# Patient Record
Sex: Male | Born: 1955 | Race: Black or African American | Hispanic: No | Marital: Married | Smoking: Current every day smoker
Health system: Southern US, Community
[De-identification: ages and names within clinical notes are randomized; demographics above are authoritative.]

## PROBLEM LIST (undated history)

## (undated) DIAGNOSIS — E119 Type 2 diabetes mellitus without complications: Secondary | ICD-10-CM

## (undated) DIAGNOSIS — F209 Schizophrenia, unspecified: Secondary | ICD-10-CM

---

## 2003-12-29 ENCOUNTER — Emergency Department (HOSPITAL_COMMUNITY): Admission: EM | Admit: 2003-12-29 | Discharge: 2003-12-29 | Payer: Self-pay | Admitting: Emergency Medicine

## 2007-04-10 ENCOUNTER — Emergency Department (HOSPITAL_COMMUNITY): Admission: EM | Admit: 2007-04-10 | Discharge: 2007-04-10 | Payer: Self-pay | Admitting: Emergency Medicine

## 2007-04-26 ENCOUNTER — Emergency Department (HOSPITAL_COMMUNITY): Admission: EM | Admit: 2007-04-26 | Discharge: 2007-04-26 | Payer: Self-pay | Admitting: Emergency Medicine

## 2009-03-17 ENCOUNTER — Inpatient Hospital Stay: Payer: Self-pay | Admitting: Psychiatry

## 2010-04-27 ENCOUNTER — Emergency Department: Payer: Self-pay | Admitting: Emergency Medicine

## 2010-09-13 ENCOUNTER — Emergency Department (HOSPITAL_COMMUNITY)
Admission: EM | Admit: 2010-09-13 | Discharge: 2010-09-14 | Discharge status: Against Medical Advice | Payer: Medicare Other | Source: Home / Self Care | Attending: Emergency Medicine | Admitting: Emergency Medicine

## 2010-09-13 DIAGNOSIS — F29 Unspecified psychosis not due to a substance or known physiological condition: Secondary | ICD-10-CM | POA: Insufficient documentation

## 2010-09-13 LAB — CBC
HCT: 37 % — ABNORMAL LOW (ref 39.0–52.0)
MCHC: 34.6 g/dL (ref 30.0–36.0)
RDW: 12.4 % (ref 11.5–15.5)

## 2010-09-13 LAB — ETHANOL: Alcohol, Ethyl (B): <5 mg/dL (ref 0–10)

## 2010-09-13 LAB — COMPREHENSIVE METABOLIC PANEL
ALT: 16 U/L (ref 0–53)
Calcium: 10.1 mg/dL (ref 8.4–10.5)
Glucose, Bld: 214 mg/dL — ABNORMAL HIGH (ref 70–99)
Sodium: 136 mEq/L (ref 135–145)
Total Protein: 7.6 g/dL (ref 6.0–8.3)

## 2010-09-13 LAB — DIFFERENTIAL
Basophils Absolute: 0 10*3/uL (ref 0.0–0.1)
Basophils Relative: 0 % (ref 0–1)
Eosinophils Relative: 0 % (ref 0–5)
Lymphocytes Relative: 15 % (ref 12–46)
Monocytes Absolute: 0.8 10*3/uL (ref 0.1–1.0)

## 2010-09-13 LAB — RAPID URINE DRUG SCREEN, HOSP PERFORMED
Amphetamines: NOT DETECTED
Barbiturates: NOT DETECTED
Benzodiazepines: NOT DETECTED
Cocaine: NOT DETECTED
Opiates: NOT DETECTED

## 2010-09-13 LAB — ACETAMINOPHEN LEVEL: Acetaminophen (Tylenol), Serum: <10 ug/mL — ABNORMAL LOW (ref 10–30)

## 2010-09-13 LAB — SALICYLATE LEVEL: Salicylate Lvl: <4 mg/dL (ref 2.8–20.0)

## 2010-09-14 ENCOUNTER — Inpatient Hospital Stay (HOSPITAL_COMMUNITY): Admit: 2010-09-14 | Payer: Medicaid Other | Admitting: Psychiatry

## 2010-09-14 ENCOUNTER — Inpatient Hospital Stay (HOSPITAL_COMMUNITY)
Admission: RE | Admit: 2010-09-14 | Discharge: 2010-09-20 | DRG: 885 | Disposition: A | Discharge status: Home / Self Care | Payer: Medicare Other | Source: Ambulatory Visit | Attending: Psychiatry | Admitting: Psychiatry

## 2010-09-14 DIAGNOSIS — E785 Hyperlipidemia, unspecified: Secondary | ICD-10-CM

## 2010-09-14 DIAGNOSIS — Z7982 Long term (current) use of aspirin: Secondary | ICD-10-CM

## 2010-09-14 DIAGNOSIS — I10 Essential (primary) hypertension: Secondary | ICD-10-CM

## 2010-09-14 DIAGNOSIS — F17200 Nicotine dependence, unspecified, uncomplicated: Secondary | ICD-10-CM

## 2010-09-14 DIAGNOSIS — F201 Disorganized schizophrenia: Principal | ICD-10-CM

## 2010-09-14 DIAGNOSIS — E119 Type 2 diabetes mellitus without complications: Secondary | ICD-10-CM

## 2010-09-14 LAB — GLUCOSE, CAPILLARY: Glucose-Capillary: 203 mg/dL — ABNORMAL HIGH (ref 70–99)

## 2010-09-15 DIAGNOSIS — F259 Schizoaffective disorder, unspecified: Secondary | ICD-10-CM

## 2010-09-15 LAB — GLUCOSE, CAPILLARY: Glucose-Capillary: 124 mg/dL — ABNORMAL HIGH (ref 70–99)

## 2010-09-16 NOTE — H&P (Addendum)
NAME:  Julian [name], BISSONNETTE NO.:  [id number]  MEDICAL RECORD NO.:  [id number]           PATIENT TYPE:  E  LOCATION:  BH                        FACILITY:  BH  PHYSICIAN:  Eulogio Ditch, MD DATE OF BIRTH:  12-22-1955  DATE OF ADMISSION:  03/14/201                      PSYCHIATRIC ADMISSION ASSESSMENT   HISTORY OF PRESENT ILLNESS:  A 55 year old male living with the mother with a history of schizophrenia, disorganized type followed in the outpatient setting by the ACT team.  The patient is admitted because of his disorganized behavior.  The patient is grandiose thinking that he has a job in different places.  He is in this country and has a job outside the country like in [place].  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  The patient is hyperreligious. Denies hearing any voices.  Denies any suicidal or homicidal ideations. The patient is followed by Dr. Shirline Frees latiff in the outpatient settings.  PSYCH. MEDICATIONS: 1. Invega Sustenna 234 mg IM every 4 weeks. 2. Zyprexa 15 mg twice daily.  As per the note on August 09, 2010,     the patient has no side effects from these medications.  The     patient has a history of noncompliant with his medications.  The     patient lives at a group home.  SUBSTANCE ABUSE:  The patient denies any substance abuse history.  MEDICAL HISTORY:  History of diabetes, hyperlipidemia, hypertension.  ALLERGIES:  Depakote.  PHYSICAL EXAMINATION:  Done at [facility] within normal limits.  LABORATORY DATA:  Done at [facility] within normal limits.  UDS negative.  His glucose was 203.  MENTAL STATUS EXAM:  The patient is calm, cooperative during the interview.Elated mood.  Hygiene and grooming fair.  No abnormal movements noticed.  No psychomotor agitation or retardation noted. Speech increase in rate, rhythm, but has  no pressured speech.  Thought process:  Patient is redirectable.  He is grandiose.  Sometimes seems to be internally preoccupied but denies hearing any voices.  Cognition: Alert, awake, oriented x3.  Memory immediate,  Recent, remote- fair to poor.  Attention and concentration poor.  Abstraction ability poor. Fund of knowledge poor.  Insight and judgment poor.  DIAGNOSIS:  Axis I:  As per history, schizophrenia, disorganized type, rule out schizoaffective disorder. Axis II:  Deferred. Axis III:  Diabetes mellitus, hypertension, hyperlipidemia. Axis IV:  Chronic mental issues. Axis V:  30 to 40.  TREATMENT PLAN:  The patient will be started on his regular combination of medications: 1. Zyprexa 15 mg twice daily. 2. I added the Depakote ER 500 mg three times a day.  We will get     Depakote level after 5 days. 3. The patient advised to go to all the groups while staying in the     hospital. 4. Side effects, risks and benefits of the medication discussed with     the patient. 5. We will get more collateral information on this patient. 6. Estimated length of stay  in the hospital will be 5-7 days.     Eulogio Ditch, MD     SA/MEDQ  D:  09/15/2010  T:  09/15/2010  Job:  [id number]  Electronically Signed by Eulogio Ditch  on 09/16/2010 04:02:01 AM

## 2010-09-17 LAB — GLUCOSE, CAPILLARY

## 2010-09-18 LAB — GLUCOSE, CAPILLARY
Glucose-Capillary: 169 mg/dL — ABNORMAL HIGH (ref 70–99)
Glucose-Capillary: 183 mg/dL — ABNORMAL HIGH (ref 70–99)

## 2010-09-24 NOTE — Discharge Summary (Signed)
NAME:  Julian [name], Julian [name]                ACCOUNT NO.:  [id number]  MEDICAL RECORD NO.:  [id number]           PATIENT TYPE:  I  LOCATION:  0403                          FACILITY:  BH  PHYSICIAN:  Eulogio Ditch, MD DATE OF BIRTH:  1956-02-24  DATE OF ADMISSION:  09/14/2010 DATE OF DISCHARGE:  09/20/2010                              DISCHARGE SUMMARY   IDENTIFYING INFORMATION:  A 55 year old male.  This is a voluntary admission.  HISTORY OF PRESENT ILLNESS:  First [facility] admission for [place], who has a history of schizophrenia, disorganized type.  He has been living in a group home in [place] and got upset with the group home director because he is a chain smoker and they are refused to give him and his cigarettes apparently when he was demanding them at odd hours, such as wanting to smoke through the night.  He apparently called the police on the group home manager to charge her with theft to personal property.  His ACT team member was called to help negotiate the situation and admission was recommended.  He initially presented calm and cooperative.  Although he is rather resistant to rules, he responded well with explanations of the group home structure.  He is well-educated and has a Scientist, water quality in Environmental manager and is generally compliant with medications.  He has a history of multiple hospitalizations.  MEDICAL EVALUATION:  He was medically evaluated in the emergency room, where a full physical exam was done.  Normally-developed African American male who presents well-groomed and appropriately dressed. Followed by Dr. Felecia Shelling, his primary care physician.  Chronic medical problems include hypertension, diabetes, dyslipidemia and schizophrenia. He also does smoke significant amounts of cigarettes.  ADMITTING DIAGNOSTIC STUDIES:  Revealed a random glucose of 203. Salicylate and acetaminophen levels were negative.  Alcohol level negative.  Chemistry normal.  Random glucose 214, BUN 8,  creatinine 0.83.  Liver enzymes normal.  CBC:  WBC 9.1, hemoglobin 12.8, hematocrit 37, platelets 383,000.  Urine drug screen negative for all substances.  COURSE OF HOSPITALIZATION:  He was at admitted on an involuntary basis to our acute stabilization and intensive care unit and gradually assimilated into the milieu.  He was appropriate with staff and peers throughout his stay and smoking is not permitted on our unit and he complied with this without difficulty.  We resumed his routine medications, which included the Invega Sustenna 234 mg IM q. 30 days, which was last given on February 27.  Magnesium oxide 400 mg b.i.d., lisinopril 20 mg daily, metformin 500 mg b.i.d. and Zyprexa 15 mg p.o. b.i.d. and Claritin 10 mg at bedtime and HCTZ 12.5 mg daily.  Our case manager made contact with psychotherapeutic services who are following him to coordinate care.  He did not require further adjustments in medications, was calm and cooperative on the unit.  He endorsed that he was anxious to move to a group home in [place] so that he could be closer to family.  Our case manager contacted his legal guardian, who confirmed the patient's plans to be interviewed at a new group home in [place] on March 20 at 1:00  p.m.  The plan was that he would stay then with his mother for two nights in [place] and return to the group home. On the 20th, he was fully alert, in full contact with reality.  No delusional statements.  Had been taking his medications regularly.  Was in good humor, showing considerate behavior to his peers and good manners. Participation in group therapy was excellent.  DISCHARGE DIAGNOSES:  AXIS I:  Schizophrenia, disorganized type. AXIS II:  No diagnosis. AXIS III:  Diabetes, dyslipidemia. AXIS IV:  Significant issues with group home living. AXIS V: Current 57, past year 58-60.  DISCHARGE CONDITION:  Stable.  PLAN:  The plan is to follow up with the [facility] ACT team on Monday,  March 26.  DISCHARGE MEDICATIONS: 1. Invega 234 mg IM q. month, next due September 27, 2010. 2. Zyprexa 15 mg 1 tablet a.m. and at bedtime. 3. Metformin 500 mg 1 tablet b.i.d. 4. Aspirin 81 mg daily. 5. HCTZ 12.5 mg daily. 6. Lisinopril 20 mg daily. 7. Claritin 10 mg daily at bedtime. 8. Mag-Ox 400 mg twice daily. 9. Simvastatin 40 mg daily at bedtime. 10.Benadryl 50 mg daily as needed for allergy.     Margaret A. Lorin Picket, N.P.   ______________________________ Eulogio Ditch, MD    MAS/MEDQ  D:  09/21/2010  T:  09/21/2010  Job:  [id number]  Electronically Signed by Kari Baars N.P. on 09/23/2010 03:42:35 PM Electronically Signed by Eulogio Ditch  on 09/24/2010 07:17:35 AM

## 2011-04-12 LAB — DIFFERENTIAL
Eosinophils Absolute: 0.2
Eosinophils Relative: 2
Lymphocytes Relative: 20
Lymphs Abs: 2.1
Monocytes Relative: 7
Neutrophils Relative %: 72

## 2011-04-12 LAB — RAPID URINE DRUG SCREEN, HOSP PERFORMED
Amphetamines: NOT DETECTED
Benzodiazepines: NOT DETECTED
Opiates: NOT DETECTED
Tetrahydrocannabinol: NOT DETECTED

## 2011-04-12 LAB — CBC
HCT: 38.7 — ABNORMAL LOW
MCV: 90.4
RBC: 4.28
WBC: 10.6 — ABNORMAL HIGH

## 2011-04-12 LAB — BASIC METABOLIC PANEL
Chloride: 108
GFR calc Af Amer: >60
GFR calc non Af Amer: >60
Potassium: 3.8
Sodium: 144

## 2011-04-12 LAB — ETHANOL: Alcohol, Ethyl (B): <5

## 2011-04-13 LAB — WOUND CULTURE

## 2014-02-25 ENCOUNTER — Encounter (HOSPITAL_COMMUNITY): Payer: Self-pay | Admitting: Emergency Medicine

## 2014-02-25 ENCOUNTER — Emergency Department (HOSPITAL_COMMUNITY)
Admission: EM | Admit: 2014-02-25 | Discharge: 2014-02-27 | Disposition: A | Discharge status: Home / Self Care | Payer: Medicaid Other | Attending: Emergency Medicine | Admitting: Emergency Medicine

## 2014-02-25 DIAGNOSIS — F17200 Nicotine dependence, unspecified, uncomplicated: Secondary | ICD-10-CM | POA: Insufficient documentation

## 2014-02-25 DIAGNOSIS — Z79899 Other long term (current) drug therapy: Secondary | ICD-10-CM | POA: Diagnosis not present

## 2014-02-25 DIAGNOSIS — Z046 Encounter for general psychiatric examination, requested by authority: Secondary | ICD-10-CM | POA: Insufficient documentation

## 2014-02-25 DIAGNOSIS — F22 Delusional disorders: Secondary | ICD-10-CM | POA: Insufficient documentation

## 2014-02-25 DIAGNOSIS — R413 Other amnesia: Secondary | ICD-10-CM | POA: Insufficient documentation

## 2014-02-25 DIAGNOSIS — F29 Unspecified psychosis not due to a substance or known physiological condition: Secondary | ICD-10-CM | POA: Diagnosis present

## 2014-02-25 LAB — COMPREHENSIVE METABOLIC PANEL
ALT: 24 U/L (ref 0–53)
ANION GAP: 16 — AB (ref 5–15)
AST: 25 U/L (ref 0–37)
Albumin: 4.1 g/dL (ref 3.5–5.2)
Alkaline Phosphatase: 88 U/L (ref 39–117)
BILIRUBIN TOTAL: 0.3 mg/dL (ref 0.3–1.2)
BUN: 6 mg/dL (ref 6–23)
CALCIUM: 9.8 mg/dL (ref 8.4–10.5)
CO2: 24 mEq/L (ref 19–32)
Chloride: 97 mEq/L (ref 96–112)
Creatinine, Ser: 0.74 mg/dL (ref 0.50–1.35)
GFR calc Af Amer: >90 mL/min (ref >90)
GFR calc non Af Amer: >90 mL/min (ref >90)
Glucose, Bld: 206 mg/dL — ABNORMAL HIGH (ref 70–99)
Potassium: 3.4 mEq/L — ABNORMAL LOW (ref 3.7–5.3)
Sodium: 137 mEq/L (ref 137–147)
TOTAL PROTEIN: 7.7 g/dL (ref 6.0–8.3)

## 2014-02-25 LAB — RAPID URINE DRUG SCREEN, HOSP PERFORMED
Amphetamines: NOT DETECTED
BARBITURATES: NOT DETECTED
BENZODIAZEPINES: NOT DETECTED
COCAINE: NOT DETECTED
OPIATES: NOT DETECTED
Tetrahydrocannabinol: NOT DETECTED

## 2014-02-25 LAB — CBC
HEMATOCRIT: 36.3 % — AB (ref 39.0–52.0)
Hemoglobin: 12.4 g/dL — ABNORMAL LOW (ref 13.0–17.0)
MCH: 30.5 pg (ref 26.0–34.0)
MCHC: 34.2 g/dL (ref 30.0–36.0)
MCV: 89.2 fL (ref 78.0–100.0)
Platelets: 309 10*3/uL (ref 150–400)
RBC: 4.07 MIL/uL — ABNORMAL LOW (ref 4.22–5.81)
RDW: 13 % (ref 11.5–15.5)
WBC: 7.8 10*3/uL (ref 4.0–10.5)

## 2014-02-25 LAB — SALICYLATE LEVEL

## 2014-02-25 LAB — ETHANOL

## 2014-02-25 LAB — ACETAMINOPHEN LEVEL: Acetaminophen (Tylenol), Serum: <15 ug/mL (ref 10–30)

## 2014-02-25 MED ORDER — ONDANSETRON HCL 4 MG PO TABS: 4.0000 mg | ORAL_TABLET | Freq: Three times a day (TID) | ORAL | Status: DC | PRN

## 2014-02-25 MED ORDER — NICOTINE 21 MG/24HR TD PT24
21.0000 mg | MEDICATED_PATCH | Freq: Every day | TRANSDERMAL | Status: DC
  Filled 2014-02-25: qty 1

## 2014-02-25 MED ORDER — LORAZEPAM 1 MG PO TABS: 1.0000 mg | ORAL_TABLET | Freq: Three times a day (TID) | ORAL | Status: DC | PRN

## 2014-02-25 MED ORDER — ACETAMINOPHEN 325 MG PO TABS: 650.0000 mg | ORAL_TABLET | ORAL | Status: DC | PRN

## 2014-02-25 MED ORDER — IBUPROFEN 200 MG PO TABS: 600.0000 mg | ORAL_TABLET | Freq: Three times a day (TID) | ORAL | Status: DC | PRN

## 2014-02-25 NOTE — ED Notes (Signed)
Spoke with Jeni Salles of [facility] [phone]) he states pt was to be evaluated by [facility] this morning but pt was uncooperative and walk away from car he was traveling in, staff at group home [phone]) state pt did have morning medication but was denies home visit to [facility] to see mother by his Guardian DSS [facility]. Mr. Dan Humphreys states they are not a facility that can restrain so they are unable to physically restrain patient. Pt has only been at this facility less than 30 days. Pt used his own funds to come to [employer] [facility] in [employer], then to [employer] by [employer].

## 2014-02-25 NOTE — ED Notes (Signed)
Pt BIB [employer].  Pt's family owns a funeral home.  Called [employer] because pt showed up uninvited to the funeral home. They were about to do a service when they noticed that all the medals of the deceased individuals were missing.  They believe he has taken them.  Pt states that he wants to see a psychiatrist because his group home owner is trying to kill him.  Pt is paranoid when trying to get his vital signs.  Family states that he is off his meds.  "He sells cocaine and counterfeit DVDs but he got dis group home thing too!".  Denies SI/HI.

## 2014-02-25 NOTE — ED Provider Notes (Signed)
TIME SEEN: 8:50 PM  CHIEF COMPLAINT: Paranoia  HPI: Patient is a 58 year old male with a history of schizophrenia, insulin-dependent diabetes who presents to the emergency department with paranoia. Patient was brought in by [facility] police department. Patient reports that he lives at a group home and he thinks his group home staff is trying to kill him. He states "they are trying to euthanize me". He states that the group home staff are drug dealers and may have threatened him. He states they are selling cocaine and illegal DVDs.  Denies SI or HI. Denies hallucinations. States he has been taking his medication.  Please also reports that he took a bus from his group home in [place] to his brother's funeral home in [place]. At the funeral home he was acting abnormally and was agitated. Police were contacted.  Patient denies any current medical complaints. Denies any illicit drug or alcohol use.   ROS: See HPI Constitutional: no fever  Eyes: no drainage  ENT: no runny nose   Cardiovascular:  no chest pain  Resp: no SOB  GI: no vomiting GU: no dysuria Integumentary: no rash  Allergy: no hives  Musculoskeletal: no leg swelling  Neurological: no slurred speech ROS otherwise negative  PAST MEDICAL HISTORY/PAST SURGICAL HISTORY:  History reviewed. No pertinent past medical history.  MEDICATIONS:  Prior to Admission medications   Medication Sig Start Date End Date Taking? Authorizing Provider  Paliperidone Palmitate (INVEGA SUSTENNA) 234 MG/1.5ML SUSP Inject 234 mg into the muscle every 30 (thirty) days.    Yes Historical Provider, MD    ALLERGIES:  Allergies  Allergen Reactions  . Haldol [Haloperidol Lactate]     seizures  . Prolixin [Fluphenazine]     seizures    SOCIAL HISTORY:  History  Substance Use Topics  . Smoking status: Current Every Day Smoker  . Smokeless tobacco: Not on file  . Alcohol Use: No    FAMILY HISTORY: History reviewed. No pertinent family  history.  EXAM: BP 136/69  Pulse 118  Temp(Src) 98.9 F (37.2 C) (Oral)  Resp 18  SpO2 100% CONSTITUTIONAL: Alert and oriented and responds appropriately to questions. Well-appearing; well-nourished HEAD: Normocephalic EYES: Conjunctivae clear, PERRL ENT: normal nose; no rhinorrhea; moist mucous membranes; pharynx without lesions noted NECK: Supple, no meningismus, no LAD  CARD: RRR; S1 and S2 appreciated; no murmurs, no clicks, no rubs, no gallops RESP: Normal chest excursion without splinting or tachypnea; breath sounds clear and equal bilaterally; no wheezes, no rhonchi, no rales,  ABD/GI: Normal bowel sounds; non-distended; soft, non-tender, no rebound, no guarding BACK:  The back appears normal and is non-tender to palpation, there is no CVA tenderness EXT: Normal ROM in all joints; non-tender to palpation; no edema; normal capillary refill; no cyanosis    SKIN: Normal color for age and race; warm NEURO: Moves all extremities equally PSYCH: Agitated, paranoid. Denies SI or HI. Grooming and personal hygiene are appropriate.  MEDICAL DECISION MAKING: Patient here with paranoia. He has a history of paranoid schizophrenia and lives in a group home. Screening labs and urine are unremarkable. Will consult psychiatry. Given patient is paranoid and agitated, I have filled out involuntary commitment paperwork. I feel he is a danger to himself and others and needs further psychiatric evaluation.      Layla Maw Levonia Wolfley, DO 02/25/14 2252

## 2014-02-25 NOTE — ED Notes (Signed)
Per [employer] Group home can not come get pt at this time.

## 2014-02-25 NOTE — ED Notes (Signed)
Patient ambulatory to treatment area 39 with a steady gait escorted by security Jonny Ruiz and NT.  No acute distress noted.

## 2014-02-25 NOTE — ED Notes (Signed)
Belongings in hallway closet 

## 2014-02-26 ENCOUNTER — Encounter (HOSPITAL_COMMUNITY): Payer: Self-pay | Admitting: Registered Nurse

## 2014-02-26 DIAGNOSIS — F29 Unspecified psychosis not due to a substance or known physiological condition: Secondary | ICD-10-CM | POA: Diagnosis present

## 2014-02-26 LAB — CBG MONITORING, ED
GLUCOSE-CAPILLARY: 166 mg/dL — AB (ref 70–99)
Glucose-Capillary: 136 mg/dL — ABNORMAL HIGH (ref 70–99)
Glucose-Capillary: 230 mg/dL — ABNORMAL HIGH (ref 70–99)
Glucose-Capillary: 177 mg/dL — ABNORMAL HIGH (ref 70–99)

## 2014-02-26 MED ORDER — GLIMEPIRIDE 1 MG PO TABS
1.0000 mg | ORAL_TABLET | Freq: Every day | ORAL | Status: DC
  Administered 2014-02-27: 1 mg via ORAL
  Filled 2014-02-26 (×3): qty 1

## 2014-02-26 MED ORDER — FOLIC ACID 1 MG PO TABS
1.0000 mg | ORAL_TABLET | Freq: Every day | ORAL | Status: DC
  Administered 2014-02-26 – 2014-02-27 (×2): 1 mg via ORAL
  Filled 2014-02-26 (×2): qty 1

## 2014-02-26 MED ORDER — ACETAMINOPHEN 500 MG PO TABS: 500.0000 mg | ORAL_TABLET | Freq: Four times a day (QID) | ORAL | Status: DC | PRN

## 2014-02-26 MED ORDER — OLANZAPINE 10 MG PO TABS
20.0000 mg | ORAL_TABLET | Freq: Every day | ORAL | Status: DC
  Administered 2014-02-26: 20 mg via ORAL
  Filled 2014-02-26: qty 2

## 2014-02-26 MED ORDER — CLONAZEPAM 0.5 MG PO TABS
0.5000 mg | ORAL_TABLET | Freq: Three times a day (TID) | ORAL | Status: DC
  Administered 2014-02-26 – 2014-02-27 (×3): 0.5 mg via ORAL
  Filled 2014-02-26 (×4): qty 1

## 2014-02-26 MED ORDER — OLANZAPINE 5 MG PO TABS
5.0000 mg | ORAL_TABLET | ORAL | Status: DC
  Administered 2014-02-26 – 2014-02-27 (×2): 5 mg via ORAL
  Filled 2014-02-26 (×2): qty 1

## 2014-02-26 MED ORDER — LINAGLIPTIN 5 MG PO TABS
5.0000 mg | ORAL_TABLET | Freq: Every day | ORAL | Status: DC
  Administered 2014-02-27: 5 mg via ORAL
  Filled 2014-02-26 (×3): qty 1

## 2014-02-26 MED ORDER — HYDROXYZINE HCL 25 MG PO TABS: 25.0000 mg | ORAL_TABLET | ORAL | Status: DC | PRN

## 2014-02-26 MED ORDER — SIMVASTATIN 40 MG PO TABS
40.0000 mg | ORAL_TABLET | Freq: Every day | ORAL | Status: DC
  Administered 2014-02-26 – 2014-02-27 (×2): 40 mg via ORAL
  Filled 2014-02-26 (×2): qty 1

## 2014-02-26 MED ORDER — NICOTINE POLACRILEX 2 MG MT GUM: 4.0000 mg | CHEWING_GUM | OROMUCOSAL | Status: DC | PRN

## 2014-02-26 MED ORDER — LISINOPRIL 20 MG PO TABS
20.0000 mg | ORAL_TABLET | Freq: Every day | ORAL | Status: DC
  Administered 2014-02-26 – 2014-02-27 (×2): 20 mg via ORAL
  Filled 2014-02-26 (×2): qty 1

## 2014-02-26 MED ORDER — LORATADINE 10 MG PO TABS: 10.0000 mg | ORAL_TABLET | Freq: Every day | ORAL | Status: DC | PRN

## 2014-02-26 MED ORDER — BENZTROPINE MESYLATE 1 MG PO TABS
1.0000 mg | ORAL_TABLET | Freq: Two times a day (BID) | ORAL | Status: DC
  Administered 2014-02-26 – 2014-02-27 (×3): 1 mg via ORAL
  Filled 2014-02-26 (×3): qty 1

## 2014-02-26 NOTE — BH Assessment (Signed)
Possible discharge back to group home. Obtain collateral information from group home. Also, TTS to contact guardian.

## 2014-02-26 NOTE — Progress Notes (Signed)
CSW spoke with Willis Modena, DSS guardianship supervisor.  She reported that Mr. Christene Lye stated that he was not aware the patient was discharged.  She reports Mr. Christene Lye will arrive to the ED in the morning to pick up the patient and will call in the morning to give the CSW ETA.      Maryelizabeth Rowan, MSW, [place], 02/26/2014 Evening Clinical Social Worker [phone]

## 2014-02-26 NOTE — Progress Notes (Signed)
  CARE MANAGEMENT ED NOTE 02/26/2014  Patient:  Julian [name], Julian [name]   Account Number:  [id number]  Date Initiated:  02/26/2014  Documentation initiated by:  Edd Arbour  Subjective/Objective Assessment:   58 yr old medicaid of [place] pt with Pt BIB [employer].  Pt's family owns a funeral home.  Called [employer] because pt showed up uninvited to the funeral home. They were about to do a service when they noticed that all the medals of the deceased individuals     Subjective/Objective Assessment Detail:   were missing.  They believe he has taken them.  Pt states that he wants to see a psychiatrist because his group home owner is trying to kill him.  Pt is paranoid when trying to get his vital signs    Dx psychotic disorder    pcp Dr Mertha Baars [facility]  [address]  [place], [place] [address]  [phone]     Action/Plan:   ED CM spoke with pt updated epic   Action/Plan Detail:   Anticipated DC Date:       Status Recommendation to Physician:   Result of Recommendation:    Other ED Services  Consult Working Plan    DC Planning Services  Other  Outpatient Services - Pt will follow up  PCP issues    Choice offered to / List presented to:            Status of service:  Completed, signed off  ED Comments:   ED Comments Detail:

## 2014-02-26 NOTE — Progress Notes (Signed)
CSW received a call from [employer] from the [facility] police department.  He report that he is familiar with this group home and stated that the person in charge at this time stated [name] is having a medical procedure done.  CSW spoke with Rodney Cruise [phone] who reports that the patient is not a resident of that specific group home address and the company has several homes in the [place] [facility] area.  She provided CSW with the owner's number [facility] [phone]. CSW called the owner and left a message.    CSW spoke with a supervisor with DSS named Tawana Scale [phone]. She made APS supervisor aware of the situation and reported that they will get in contact with the patient's guardian then call CSW back.     Maryelizabeth Rowan, MSW, [place], 02/26/2014 Evening Clinical Social Worker [phone]

## 2014-02-26 NOTE — ED Notes (Addendum)
Watching tv in day room 

## 2014-02-26 NOTE — ED Notes (Signed)
Pt in dayroom at present, no distress noted, will monitor for safety.

## 2014-02-26 NOTE — ED Notes (Signed)
Watching tv in day room 

## 2014-02-26 NOTE — Progress Notes (Signed)
Mr Cornelio stopped the chaplain in the hall to tell him that after his (his words) "being bathed in the blood of Judaism" and his conversion away from Christianity, the [place] congregation has rejected him. He is not sure if the rabbi has rejected him but he is sure the rest of the congregation has. He asked for prayer and it was given.  Julian [name]. Twylla Arceneaux, DMin Chaplain

## 2014-02-26 NOTE — Discharge Instructions (Signed)
Confusion Confusion is the inability to think with your usual speed or clarity. Confusion may come on quickly or slowly over time. How quickly the confusion comes on depends on the cause. Confusion can be due to any number of causes. CAUSES   Concussion, head injury, or head trauma.  Seizures.  Stroke.  Fever.  Brain tumor.  Age related decreased brain function (dementia).  Heightened emotional states like rage or terror.  Mental illness in which the person loses the ability to determine what is real and what is not (hallucinations).  Infections such as a urinary tract infection (UTI).  Toxic effects from alcohol, drugs, or prescription medicines.  Dehydration and an imbalance of salts in the body (electrolytes).  Lack of sleep.  Low blood sugar (diabetes).  Low levels of oxygen from conditions such as chronic lung disorders.  Drug interactions or other medicine side effects.  Nutritional deficiencies, especially niacin, thiamine, vitamin C, or vitamin B.  Sudden drop in body temperature (hypothermia).  Change in routine, such as when traveling or hospitalized. SIGNS AND SYMPTOMS  People often describe their thinking as cloudy or unclear when they are confused. Confusion can also include feeling disoriented. That means you are unaware of where or who you are. You may also not know what the date or time is. If confused, you may also have difficulty paying attention, remembering, and making decisions. Some people also act aggressively when they are confused.  DIAGNOSIS  The medical evaluation of confusion may include:  Blood and urine tests.  X-rays.  Brain and nervous system tests.  Analyzing your brain waves (electroencephalogram or EEG).  Magnetic resonance imaging (MRI) of your head.  Computed tomography (CT) scan of your head.  Mental status tests in which your health care provider may ask many questions. Some of these questions may seem silly or strange,  but they are a very important test to help diagnose and treat confusion. TREATMENT  An admission to the hospital may not be needed, but a person with confusion should not be left alone. Stay with a family member or friend until the confusion clears. Avoid alcohol, pain relievers, or sedative drugs until you have fully recovered. Do not drive until directed by your health care provider. HOME CARE INSTRUCTIONS  What family and friends can do:  To find out if someone is confused, ask the person to state his or her name, age, and the date. If the person is unsure or answers incorrectly, he or she is confused.  Always introduce yourself, no matter how well the person knows you.  Often remind the person of his or her location.  Place a calendar and clock near the confused person.  Help the person with his or her medicines. You may want to use a pill box, an alarm as a reminder, or give the person each dose as prescribed.  Talk about current events and plans for the day.  Try to keep the environment calm, quiet, and peaceful.  Make sure the person keeps follow-up visits with his or her health care provider. PREVENTION  Ways to prevent confusion:  Avoid alcohol.  Eat a balanced diet.  Get enough sleep.  Take medicine only as directed by your health care provider.  Do not become isolated. Spend time with other people and make plans for your days.  Keep careful watch on your blood sugar levels if you are diabetic. SEEK IMMEDIATE MEDICAL CARE IF:   You develop severe headaches, repeated vomiting, seizures, blackouts, or  slurred speech. °· There is increasing confusion, weakness, numbness, restlessness, or personality changes. °· You develop a loss of balance, have marked dizziness, feel uncoordinated, or fall. °· You have delusions, hallucinations, or develop severe anxiety. °· Your family members think you need to be rechecked. °Document Released: 07/27/2004 Document Revised: 11/03/2013  Document Reviewed: 07/25/2013 °ExitCare® Patient Information ©2015 [employer], LLC. This information is not intended to replace advice given to you by your health care provider. Make sure you discuss any questions you have with your health care provider. ° °Psychosis °Psychosis refers to a severe lack of understanding with reality. During a psychotic episode, you are not able to think clearly. During a psychotic episode, your responses and emotions are inappropriate and do not coincide with what is actually happening. You often have false beliefs about what is happening or who you are (delusions), and you may see, hear, taste, smell, or feel things that are not present (hallucinations). Psychosis is usually a severe symptom of a very serious mental health (psychiatric) condition, but it can sometimes be the result of a medical condition. °CAUSES  °· Psychiatric conditions, such as: °¨ Schizophrenia. °¨ Bipolar disorder. °¨ Depression. °¨ Personality disorders. °· Alcohol or drug abuse. °· Medical conditions, such as: °¨ Brain injury. °¨ Brain tumor. °¨ Dementia. °¨ Brain diseases, such as Alzheimer's, Parkinson's, or Huntington's disease. °¨ Neurological diseases, such as epilepsy. °· Genetic disorders. °· Metabolic disorders. °· Infections that affect the brain. °· Certain prescription drugs. °· Stroke. °SYMPTOMS  °· Unable to think or speak clearly or respond appropriately. °· Disorganized thinking (thoughts jump from one thought to another). °· Severe inappropriate behavior. °· Delusions may include: °¨ A strong belief that is odd, unrealistic, or false. °¨ Feeling extremely fearful or suspicious (paranoid). °¨ Believing you are someone else, have high importance, or have an altered identity. °· Hallucinations. °DIAGNOSIS  °· Mental health evaluation. °· Physical exam. °· Blood tests. °· Computerized magnetic scan (MRI) or other brain scans. °TREATMENT  °Your caregiver will recommend a course of treatment that  depends on the cause of the psychosis. °Treatment may include: °· Monitoring and supportive care in the hospital. °· Taking medicines (antipsychotic medicine) to reduce symptoms and balance chemicals in the brain. °· Taking medicines to manage underlying mental health conditions. °· Therapy and other supportive programs outside of the hospital. °· Treating an underlying medical condition. °If the cause of the psychosis can be treated or corrected, the outlook is good. Without treatment, psychotic episodes can cause danger to yourself or others. Treatment may be short-term or lifelong. °HOME CARE INSTRUCTIONS  °· Take all medicines as directed. This is important. °· Use a pillbox or write down your medicine schedule to make sure you are taking them. °· Check with your caregiver before using over-the-counter medicines, herbs, or supplements. °· Seek individual and family support through therapy and mental health education (psychoeducation) programs. These will help you manage symptoms and side effects of medicines, learn life skills, and maintain a healthy routine. °· Maintain a healthy lifestyle. °· Exercise regularly. °· Avoid alcohol and drugs. °· Learn ways to reduce stress and cope with stress, such as yoga and meditation. °· Talk about your feelings with family members or caregivers. °· Make time for yourself to do things you enjoy. °· Know the early warning signs of psychosis. Your caregiver will recommend steps to take when you notice symptoms such as: °¨ Feeling anxious or preoccupied. °¨ Having racing thoughts. °¨ Changes in your interest in   life and relationships.  Follow up with your caregivers for continued outpatient treatment as directed. SEEK MEDICAL CARE IF:   Medicines do not seem to be helping.  You hear voices telling you to do things.  You see, smell, or feel things that are not there.  You feel hopeless and overwhelmed.  You feel extremely fearful and suspicious that something will  harm you.  You feel like you cannot leave your house.  You have trouble taking care of yourself.  You experience side effects of medicines, such as changes in sleep patterns, dizziness, weight gain, restlessness, movement changes, muscle spasms, or tremors. SEEK IMMEDIATE MEDICAL CARE IF:  Severe psychotic symptoms present a safety issue (such as an urge to hurt yourself or others). MAKE SURE YOU:   Understand these instructions.  Will watch your condition.  Will get help right away if you are not doing well or get worse. FOR MORE INFORMATION  National Institute of Mental Health: http://www.maynard.net/ Document Released: 12/07/2009 Document Revised: 09/11/2011 Document Reviewed: 12/07/2009 [facility] Patient Information 2015 [place], [place]. This information is not intended to replace advice given to you by your health care provider. Make sure you discuss any questions you have with your health care provider.  Paranoia Paranoia is a distrust of others that is not based on a real reason for distrust. This may reach delusional levels. This means the paranoid person feels the world is against them when there is no reason to make them feel that way. People with paranoia feel as though people around them are "out to get them".  SIMILAR MENTAL ILNESSES  Depression is a feeling as though you are down all the time. It is normal in some situations where you have just lost a loved one. It is abnormal if you are having feelings of paranoia with it.  Dementia is a physical problem with the brain in which the brain no longer works properly. There are problems with daily activities of living. Alzheimer's disease is one example of this. Dementia is also caused by old age changes in the brain which come with the death of brain cells and small strokes.  Paranoidschizophrenia. People with paranoid schizophrenia and persecutory delusional disorder have delusions in which they feel people around them are plotting  against them. Persecutory delusions in paranoid schizophrenia are bizarre, sometimes grandiose, and often accompanied by auditory hallucinations. This means the person is hearing voices that are not there.  Delusionaldisorder (persecutory type). Delusions experienced by individuals with delusional disorder are more believable than those experienced by paranoid schizophrenics; they are not bizarre, though still unjustified. Individuals with delusional disorder may seem offbeat or quirky rather than mentally ill, and therefore, may never seek treatment. All of these problems usually do not allow these people to interact socially in an acceptable manner. CAUSES The cause of paranoia is often not known. It is common in people with extended abuse of:  Cocaine.  Amphetamine.  Marijuana.  Alcohol. Sometimes there is an inherited tendency. It may be associated with stress or changes in brain chemistry. DIAGNOSIS  When paranoia is present, your caregiver may:  Refer you to a specialist.  Do a physical exam.  Perform other tests on you to make sure there are not other problems causing the paranoia including:  Physical problems.  Mental problems.  Chemical problems (other than drugs). Testing may be done to determine if there is a psychiatric disability present that can be treated with medicine. TREATMENT   Paranoia that is a symptom  of a psychiatric problem should be treated by professionals.  Medicines are available which can help this disorder. Antipsychotic medicine may be prescribed by your caregiver.  Sometimes psychotherapy may be useful.  Conditions such as depression or drug abuse are treated individually. If the paranoia is caused by drug abuse, a treatment facility may be helpful. Depression may be helped by antidepressants. PROGNOSIS   Paranoid people are difficult to treat because of their belief that everyone is out to get them or harm them. Because of this mistrust, they  often must be talked into entering treatment by a trusted family member or friend. They may not want to take medicine as they may see this as an attempt to poison them.  Gradual gains in the trust of a therapist or caregiver helps in a successful treatment plan.  Some people with PPD or persecutory delusional disorder function in society without treatment in limited fashion. Document Released: 06/22/2003 Document Revised: 09/11/2011 Document Reviewed: 02/25/2008 [facility] Patient Information 2015 [place]. This information is not intended to replace advice given to you by your health care provider. Make sure you discuss any questions you have with your health care provider.

## 2014-02-26 NOTE — ED Notes (Signed)
Time approx--Patients guardian-[place] [employer] called for update.  Will have CSW call after evaluation by MD.  [phone].

## 2014-02-26 NOTE — ED Notes (Signed)
Up in the bathroom to shower and change scrubs 

## 2014-02-26 NOTE — ED Notes (Signed)
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

## 2014-02-26 NOTE — BH Assessment (Signed)
Assessment Note  Julian [name] is an 58 y.o. male resident of [facility] BIB by police. Pt took bus to his brother's funeral home unexpectedly today. Pt was behaving strangely and there were some concerns that he made been trying to steal items from a deceased person. The police were called and he was brought to ED. Pt was placed under IVC by EDP, Dr. Elesa Massed as he appeared paranoid and possible a danger to himself and others. At the time of the assessment pt was tired but answered the questions as he was able. He denies HI/SI, denied a/v hallucinations, and self-harm. "I don't have SI, HI, or psychosis." Pt is oriented times four and cooperative.   Pt reports his current stressors include the group home director trying to kill him, and that the group home director is selling drugs and illegal DVDs. Pt sts director is trying to kill him by giving him too much of his medication, and trying to make him take a third injection in a week. Pt sts he sees a doctor and counselor at [facility], and the the [facility] is not giving him the correct medications. Group home reports pt is allowed to return pending a psychiatric evaluation, it pt is willing to return. Pt sts he would feel safe returning to [facility], "Because the police are down his throat now, watching him so he can't do anything to me." Pt also list not having any checks for three months, and being unable to see his mom who had knee surgery as stressors. GH reports pt has decompensated since not being allowed to see his mother.   Pt denies symptoms of depression, denies anxiety put believes [facility] staff are purposely trying to kill him. Pt denies changes in eating or sleeping and reports he is getting about 8 hours a sleep a night.   Pt denies current alcohol use, sts last use was 12/14. Denies history of substance abuse. Pt was negative for alcohol and drugs upon arrival to ED.   Pt reports he has been hospitalized recently for telling on his [facility] for selling  drugs. 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 Pt appears to be suffering from persecutory delusions and grandiose delusions, "I have a phD in high energy physics."   Axis I: 295.90 Schizophrenia Axis II: Deferred Axis III: History reviewed. No pertinent past medical history. Axis IV: economic problems, housing problems, other psychosocial or environmental problems, problems related to legal system/crime, problems related to social environment and problems with primary support group Axis V: 31-40 impairment in reality testing  Past Medical History: History reviewed. No pertinent past medical history.  History reviewed. No pertinent past surgical history.  Family History: History reviewed. No pertinent family history.  Social History:  reports that he has been smoking.  He does not have any smokeless tobacco history on file. He reports that he does not drink alcohol or use illicit drugs.  Additional Social History:  Alcohol / Drug Use Pain Medications: See MAR Prescriptions: See MAR Over the Counter: See MAR History of alcohol / drug use?: No history of alcohol / drug abuse (denies any use of drugs, reports last use of alcohol was Decemeber 2014) Longest period of sobriety (when/how long): over a year Negative Consequences of Use:  (none) Withdrawal Symptoms:  (none)  CIWA: CIWA-Ar BP: 114/61 mmHg Pulse Rate: 102 COWS:    Allergies:  Allergies  Allergen Reactions  . Haldol [Haloperidol Lactate]     seizures  .  Prolixin [Fluphenazine]     seizures    Home Medications:  (Not in a hospital admission)  OB/GYN Status:  No LMP for male patient.  General Assessment Data [facility] of Assessment: [facility] Is this a Tele or Face-to-Face Assessment?: Tele Assessment Is this an Initial Assessment or a Re-assessment for this encounter?: Initial Assessment Living Arrangements:  ([facility]) Can pt return to current living arrangement?: Yes Admission  Status: Involuntary Is patient capable of signing voluntary admission?: No Transfer from: Other (Comment) Referral Source: Self/Family/Friend     [facility] Crisis Care Plan Living Arrangements:  ([facility]) Name of Psychiatrist: Dr. Pecolia Ades at [facility] Name of Therapist: pt does not know at [facility] Status Is patient currently in school?: No Current Grade: no Highest grade of school patient has completed: "Phd in [employer] Physics" Name of school: na Contact person: Guardian through [facility]  Risk to self with the past 6 months Suicidal Ideation: No Suicidal Intent: No Is patient at risk for suicide?: No Suicidal Plan?: No Access to Means: No What has been your use of drugs/alcohol within the last 12 months?: Denies substance use reports last etoh use 12/14 Previous Attempts/Gestures: No How many times?: 0 Other Self Harm Risks: none Triggers for Past Attempts: None known Intentional Self Injurious Behavior: None Family Suicide History: No Recent stressful life event(s): Conflict (Comment) (with group home and with family) Persecutory voices/beliefs?: Yes Depression: No Depression Symptoms:  (reports none) Substance abuse history and/or treatment for substance abuse?: No Suicide prevention information given to non-admitted patients: Not applicable  Risk to Others within the past 6 months Homicidal Ideation: No Thoughts of Harm to Others: No Current Homicidal Intent: No Current Homicidal Plan: No Access to Homicidal Means: No Identified Victim: none History of harm to others?: No Assessment of Violence: None Noted Violent Behavior Description: none Does patient have access to weapons?: No Criminal Charges Pending?: Yes Describe Pending Criminal Charges: tresspassing in [place] may have been dropped per pt Does patient have a court date: No  Psychosis Hallucinations: None noted Delusions: Persecutory  Mental Status  Report Appear/Hygiene: Unremarkable;In scrubs Eye Contact: Poor Motor Activity: Unremarkable Speech: Logical/coherent Level of Consciousness: Drowsy Mood:  (neutral) Affect:  (congruent to mood) Anxiety Level: Minimal Thought Processes: Circumstantial Judgement: Impaired Orientation: Person;Place;Time;Situation Obsessive Compulsive Thoughts/Behaviors: None  Cognitive Functioning Concentration: Normal Memory: Recent Intact;Remote Intact IQ: Average Insight: Poor Impulse Control: Fair Appetite: Good Weight Loss: 0 Weight Gain: 0 Sleep: No Change Total Hours of Sleep: 8 Vegetative Symptoms: None  ADLScreening [facility] Assessment Services) Patient's cognitive ability adequate to safely complete daily activities?: Yes Patient able to express need for assistance with ADLs?: Yes Independently performs ADLs?: Yes (appropriate for developmental age)  Prior Inpatient Therapy Prior Inpatient Therapy: Yes Prior Therapy Dates: pt does not recall all but reports recently at [facility]  Prior Therapy Facilty/Provider(s): [facility] Reason for Treatment: Delusions, paranoia  Prior Outpatient Therapy Prior Outpatient Therapy: Yes Prior Therapy Dates: current Prior Therapy Facilty/Provider(s): [facility] Reason for Treatment: medication management, counseling  ADL Screening (condition at time of admission) Patient's cognitive ability adequate to safely complete daily activities?: Yes Is the patient deaf or have difficulty hearing?: No Does the patient have difficulty seeing, even when wearing glasses/contacts?: No Does the patient have difficulty concentrating, remembering, or making decisions?: No Patient able to express need for assistance with ADLs?: Yes Does the patient have difficulty dressing or bathing?: No Independently performs ADLs?: Yes (appropriate for  developmental age)       Abuse/Neglect Assessment (Assessment to be complete while patient is alone) Physical Abuse:  Denies (reports he feels abandoned by his family) Verbal Abuse: Denies Sexual Abuse: Denies Exploitation of patient/patient's resources: Denies Self-Neglect: Denies Values / Beliefs Cultural Requests During Hospitalization: None Spiritual Requests During Hospitalization: None   Advance Directives (For Healthcare) Does patient have an advance directive?: Yes (Pt reports he has a health care directive or living will) Nutrition Screen- [facility] Adult/[facility] Patient's home diet: Regular  Additional Information 1:1 In Past 12 Months?: Yes CIRT Risk: No Elopement Risk: Yes Does patient have medical clearance?: Yes     Disposition:  Per Donell Sievert, PA pt should be evaluated by psychiatry in the morning for final disposition recommendations and to determine if he can be discharged back to his providers.    Clista Bernhardt, [facility] Triage Specialist 02/26/2014 1:50 AM  On Site Evaluation by:   Reviewed with Physician:    Resa Miner 02/26/2014 1:39 AM

## 2014-02-26 NOTE — Consult Note (Signed)
Face to face evaluation and I agree with this note 

## 2014-02-26 NOTE — Progress Notes (Addendum)
CSW called group home number [facility]i [facility] [phone]) and left a message.  CSW called and left a message with the on call DSS social worker [phone] and spoke with [place].  CSW received a called back from [place] who suggested by we contact the non-emergency dispatcher to send an officer the group to make them aware that this patient need to be picked up.  CSW called [facility] [place], [place] non-emergency [phone] and spoke with Clovis Riley who will send an Technical sales engineer.  We are awaiting a call back from an officer.       Maryelizabeth Rowan, MSW, [place], 02/26/2014 Evening Clinical Social Worker [phone]
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
## 2014-02-26 NOTE — ED Notes (Signed)
Pt declined CBG per [facility]

## 2014-02-26 NOTE — ED Notes (Signed)
Up to the bathroom 

## 2014-02-26 NOTE — ED Notes (Signed)
Dr taylor and shuvon into see 

## 2014-02-26 NOTE — [facility] Suicide Risk Assessment (Cosign Needed)
Suicide Risk Assessment  Discharge Assessment     Demographic Factors:  Male  Total Time spent with patient: 30 minutes  Psychiatric Specialty Exam:     Blood pressure 118/84, pulse 91, temperature 98.4 F (36.9 C), temperature source Oral, resp. rate 18, SpO2 99.00%.There is no height or weight on file to calculate BMI.   General Appearance: Casual and Disheveled   Eye Contact:: Good   Speech: Clear and Coherent and Normal Rate   Volume: Normal   Mood: Anxious   Affect: Congruent   Thought Process: Circumstantial and Confusion   Orientation: Other: Person and place   Thought Content: Rumination   Suicidal Thoughts: No   Homicidal Thoughts: No   Memory: Immediate; Fair  Recent; Fair  Remote; Poor   Judgement: Impaired   Insight: Lacking   Psychomotor Activity: Normal   Concentration: Poor   Recall: Poor   Fund of Knowledge:Poor   Language: Fair   Akathisia: No   Handed: Right   AIMS (if indicated):   Assets: Desire for Improvement  Housing   Sleep:   Musculoskeletal:  Strength & Muscle Tone: within normal limits  Gait & Station: normal  Patient leans: N/A  Mental Status Per Nursing Assessment::   On Admission:     Current Mental Status by Physician: Denies suicidal/homicidal ideation, and psychosis  Loss Factors: NA  Historical Factors: NA  Risk Reduction Factors:   Living with another person, especially a relative and Positive social support  Continued Clinical Symptoms:  Previous Psychiatric Diagnoses and Treatments  Cognitive Features That Contribute To Risk:  Loss of executive function    Suicide Risk:  Minimal: No identifiable suicidal ideation.  Patients presenting with no risk factors but with morbid ruminations; may be classified as minimal risk based on the severity of the depressive symptoms  Discharge Diagnoses:  AXIS I: Psychotic Disorder NOS  AXIS II: Deferred  AXIS III: History reviewed. No pertinent past medical history.  AXIS  IV: other psychosocial or environmental problems  AXIS V: 61-70 mild symptoms  Plan Of Care/Follow-up recommendations:  Activity:  as tolerated Diet:  as tolerated  Is patient on multiple antipsychotic therapies at discharge:  No   Has Patient had three or more failed trials of antipsychotic monotherapy by history:  No  Recommended Plan for Multiple Antipsychotic Therapies: NA    Julian Leccese, FNP-BC 02/26/2014, 1:05 PM

## 2014-02-26 NOTE — ED Notes (Signed)
Up to the desk , nad wanting to have his blood sugar checked again so he can show the dr how much it changes.

## 2014-02-26 NOTE — BH Assessment (Addendum)
Requested TA equipment be set up.  Aundra Millet, RN will have equipment set up and call [facility] desktop once it is ready.   TA to commence shortly.   Cart attempted to call to [facility] but would not connect. Per Aundra Millet, RN will try again in 2 minutes as it rings but will not pick up.  Clista Bernhardt, [facility] Triage Specialist 02/26/2014 12:37 AM

## 2014-02-26 NOTE — ED Notes (Signed)
IVC rescinded 

## 2014-02-26 NOTE — ED Notes (Signed)
Up on the phone 

## 2014-02-27 LAB — CBG MONITORING, ED: Glucose-Capillary: 193 mg/dL — ABNORMAL HIGH (ref 70–99)

## 2014-02-27 NOTE — ED Notes (Signed)
Pt d/c with group home staff. All items returned. D/C instructions reviewed with pt and group home staff. Pt denies si and hi.
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
## 2014-02-27 NOTE — Progress Notes (Signed)
CSW received call from pt's guardian, Margretta Ditty. Prunit states he is en route to hospital to pick up pt and will be here at 10am.   Mariann Laster,     ED CSW  phone: [phone] 8:39am

## 2014-06-10 LAB — CBC
HCT: 36 % — ABNORMAL LOW (ref 40.0–52.0)
HGB: 12.1 g/dL — ABNORMAL LOW (ref 13.0–18.0)
MCH: 31.2 pg (ref 26.0–34.0)
MCHC: 33.5 g/dL (ref 32.0–36.0)
MCV: 93 fL (ref 80–100)
Platelet: 592 10*3/uL — ABNORMAL HIGH (ref 150–440)
RBC: 3.87 10*6/uL — AB (ref 4.40–5.90)
RDW: 13.3 % (ref 11.5–14.5)
WBC: 10.3 10*3/uL (ref 3.8–10.6)

## 2014-06-10 LAB — URINALYSIS, COMPLETE
BILIRUBIN, UR: NEGATIVE
Bacteria: NONE SEEN
Blood: NEGATIVE
Glucose,UR: NEGATIVE mg/dL (ref 0–75)
Ketone: NEGATIVE
LEUKOCYTE ESTERASE: NEGATIVE
Nitrite: NEGATIVE
Ph: 6 (ref 4.5–8.0)
Protein: NEGATIVE
RBC,UR: NONE SEEN /HPF (ref 0–5)
Specific Gravity: 1.005 (ref 1.003–1.030)
WBC UR: <1 /HPF (ref 0–5)

## 2014-06-10 LAB — COMPREHENSIVE METABOLIC PANEL
ALK PHOS: 78 U/L
ANION GAP: 7 (ref 7–16)
AST: 23 U/L (ref 15–37)
Albumin: 3.3 g/dL — ABNORMAL LOW (ref 3.4–5.0)
BUN: 10 mg/dL (ref 7–18)
Bilirubin,Total: 0.1 mg/dL — ABNORMAL LOW (ref 0.2–1.0)
CALCIUM: 9.2 mg/dL (ref 8.5–10.1)
CHLORIDE: 103 mmol/L (ref 98–107)
Co2: 29 mmol/L (ref 21–32)
Creatinine: 0.7 mg/dL (ref 0.60–1.30)
EGFR (African American): >60
Glucose: 99 mg/dL (ref 65–99)
OSMOLALITY: 277 (ref 275–301)
Potassium: 3.8 mmol/L (ref 3.5–5.1)
SGPT (ALT): 25 U/L
Sodium: 139 mmol/L (ref 136–145)
Total Protein: 7.7 g/dL (ref 6.4–8.2)

## 2014-06-10 LAB — SALICYLATE LEVEL: Salicylates, Serum: 3.4 mg/dL — ABNORMAL HIGH

## 2014-06-10 LAB — DRUG SCREEN, URINE

## 2014-06-10 LAB — ACETAMINOPHEN LEVEL

## 2014-06-10 LAB — ETHANOL: Ethanol: <3 mg/dL

## 2014-06-11 ENCOUNTER — Inpatient Hospital Stay: Payer: Self-pay | Admitting: Psychiatry

## 2014-06-13 LAB — LIPID PANEL
Cholesterol: 153 mg/dL (ref 0–200)
HDL Cholesterol: 42 mg/dL (ref 40–60)
Ldl Cholesterol, Calc: 94 mg/dL (ref 0–100)
Triglycerides: 85 mg/dL (ref 0–200)
VLDL Cholesterol, Calc: 17 mg/dL (ref 5–40)

## 2014-06-22 LAB — BASIC METABOLIC PANEL
ANION GAP: 3 — AB (ref 7–16)
BUN: 7 mg/dL (ref 7–18)
CO2: 31 mmol/L (ref 21–32)
CREATININE: 0.67 mg/dL (ref 0.60–1.30)
Calcium, Total: 8.9 mg/dL (ref 8.5–10.1)
Chloride: 107 mmol/L (ref 98–107)
Glucose: 137 mg/dL — ABNORMAL HIGH (ref 65–99)
Osmolality: 281 (ref 275–301)
Potassium: 3.9 mmol/L (ref 3.5–5.1)
Sodium: 141 mmol/L (ref 136–145)

## 2014-06-22 LAB — LITHIUM LEVEL: Lithium: <0.2 mmol/L — ABNORMAL LOW

## 2014-06-24 LAB — LITHIUM LEVEL: Lithium: 0.44 mmol/L — ABNORMAL LOW

## 2014-06-30 ENCOUNTER — Inpatient Hospital Stay: Payer: Self-pay | Admitting: Psychiatry

## 2014-06-30 LAB — URINALYSIS, COMPLETE
BLOOD: NEGATIVE
Bacteria: NONE SEEN
Bilirubin,UR: NEGATIVE
Glucose,UR: >=500 mg/dL (ref 0–75)
Ketone: NEGATIVE
Leukocyte Esterase: NEGATIVE
NITRITE: NEGATIVE
Ph: 5 (ref 4.5–8.0)
Protein: NEGATIVE
RBC,UR: NONE SEEN /HPF (ref 0–5)
Specific Gravity: 1.005 (ref 1.003–1.030)
Squamous Epithelial: <1
WBC UR: NONE SEEN /HPF (ref 0–5)

## 2014-06-30 LAB — COMPREHENSIVE METABOLIC PANEL
ALK PHOS: 92 U/L
Albumin: 3.9 g/dL (ref 3.4–5.0)
Anion Gap: 9 (ref 7–16)
BILIRUBIN TOTAL: 0.3 mg/dL (ref 0.2–1.0)
BUN: 15 mg/dL (ref 7–18)
Calcium, Total: 9.2 mg/dL (ref 8.5–10.1)
Chloride: 101 mmol/L (ref 98–107)
Co2: 29 mmol/L (ref 21–32)
Creatinine: 0.81 mg/dL (ref 0.60–1.30)
Glucose: 200 mg/dL — ABNORMAL HIGH (ref 65–99)
Osmolality: 284 (ref 275–301)
Potassium: 3.5 mmol/L (ref 3.5–5.1)
SGOT(AST): 18 U/L (ref 15–37)
SGPT (ALT): 27 U/L
Sodium: 139 mmol/L (ref 136–145)
Total Protein: 8.3 g/dL — ABNORMAL HIGH (ref 6.4–8.2)

## 2014-06-30 LAB — CBC
HCT: 39.2 % — ABNORMAL LOW (ref 40.0–52.0)
HGB: 12.7 g/dL — ABNORMAL LOW (ref 13.0–18.0)
MCH: 30 pg (ref 26.0–34.0)
MCHC: 32.5 g/dL (ref 32.0–36.0)
MCV: 93 fL (ref 80–100)
Platelet: 328 10*3/uL (ref 150–440)
RBC: 4.24 10*6/uL — ABNORMAL LOW (ref 4.40–5.90)
RDW: 13.6 % (ref 11.5–14.5)
WBC: 12 10*3/uL — ABNORMAL HIGH (ref 3.8–10.6)

## 2014-06-30 LAB — DRUG SCREEN, URINE

## 2014-06-30 LAB — SALICYLATE LEVEL: Salicylates, Serum: 6.4 mg/dL — ABNORMAL HIGH

## 2014-06-30 LAB — ETHANOL: Ethanol: <3 mg/dL

## 2014-06-30 LAB — ACETAMINOPHEN LEVEL

## 2014-07-01 LAB — LITHIUM LEVEL: Lithium: <0.2 mmol/L — ABNORMAL LOW

## 2014-07-07 LAB — LITHIUM LEVEL: Lithium: 0.29 mmol/L — ABNORMAL LOW
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
## 2015-03-21 ENCOUNTER — Emergency Department (HOSPITAL_COMMUNITY)
Admission: EM | Admit: 2015-03-21 | Discharge: 2015-03-21 | Disposition: A | Discharge status: Home / Self Care | Payer: Medicare Other | Attending: Emergency Medicine | Admitting: Emergency Medicine

## 2015-03-21 ENCOUNTER — Encounter (HOSPITAL_COMMUNITY): Payer: Self-pay | Admitting: Emergency Medicine

## 2015-03-21 DIAGNOSIS — Z791 Long term (current) use of non-steroidal anti-inflammatories (NSAID): Secondary | ICD-10-CM | POA: Insufficient documentation

## 2015-03-21 DIAGNOSIS — E119 Type 2 diabetes mellitus without complications: Secondary | ICD-10-CM | POA: Diagnosis not present

## 2015-03-21 DIAGNOSIS — F209 Schizophrenia, unspecified: Secondary | ICD-10-CM | POA: Diagnosis not present

## 2015-03-21 DIAGNOSIS — M542 Cervicalgia: Secondary | ICD-10-CM | POA: Diagnosis not present

## 2015-03-21 DIAGNOSIS — Z720 Tobacco use: Secondary | ICD-10-CM | POA: Insufficient documentation

## 2015-03-21 DIAGNOSIS — Z760 Encounter for issue of repeat prescription: Secondary | ICD-10-CM | POA: Diagnosis not present

## 2015-03-21 DIAGNOSIS — Z794 Long term (current) use of insulin: Secondary | ICD-10-CM | POA: Insufficient documentation

## 2015-03-21 DIAGNOSIS — Z79899 Other long term (current) drug therapy: Secondary | ICD-10-CM | POA: Diagnosis not present

## 2015-03-21 DIAGNOSIS — Z792 Long term (current) use of antibiotics: Secondary | ICD-10-CM | POA: Insufficient documentation

## 2015-03-21 HISTORY — DX: Schizophrenia, unspecified: F20.9

## 2015-03-21 HISTORY — DX: Type 2 diabetes mellitus without complications: E11.9

## 2015-03-21 LAB — CBG MONITORING, ED: Glucose-Capillary: 166 mg/dL — ABNORMAL HIGH (ref 65–99)

## 2015-03-21 MED ORDER — METFORMIN HCL 1000 MG PO TABS: 500.0000 mg | ORAL_TABLET | Freq: Two times a day (BID) | ORAL | Status: DC

## 2015-03-21 MED ORDER — DIPHENHYDRAMINE HCL 25 MG PO CAPS
50.0000 mg | ORAL_CAPSULE | Freq: Once | ORAL | Status: AC
  Administered 2015-03-21: 50 mg via ORAL
  Filled 2015-03-21: qty 2

## 2015-03-21 MED ORDER — DIPHENHYDRAMINE HCL 25 MG PO TABS: 25.0000 mg | ORAL_TABLET | Freq: Four times a day (QID) | ORAL | Status: DC | PRN

## 2015-03-21 NOTE — ED Provider Notes (Signed)
TIME SEEN: 3:45 AM  CHIEF COMPLAINT: Left neck pain, medication refill  HPI: Pt is a 59 y.o. male with history of diabetes, schizophrenia who presents to the emergency department with complaints of left neck pain for the past several days. Pain is worse with palpation and movement. No history of injury. States he has had similar symptoms in the past which he thinks are related to his Invega injections (has been on this for three years). States he gets Invega injections once a month and got his inject in the first of this month but his neck pain did not start until Tuesday, 5 days ago. Denies numbness, tingling or focal weakness. No bowel or bladder incontinence. No urinary retention. No fever. Reports that Benadryl normally helps with his pain. States he is living in a group home and they would not give him Benadryl and that is why he came to the emergency department.  Also reports that he was recently admitted to [facility] for his schizophrenia and was not discharged with prescription for his metformin 500 mg twice a day. He is requesting we refill this medication.   He denies any HI, SI or psychosis. Reports he feels that his schizophrenia is well controlled.  ROS: See HPI Constitutional: no fever  Eyes: no drainage  ENT: no runny nose   Cardiovascular:  no chest pain  Resp: no SOB  GI: no vomiting GU: no dysuria Integumentary: no rash  Allergy: no hives  Musculoskeletal: no leg swelling  Neurological: no slurred speech ROS otherwise negative  PAST MEDICAL HISTORY/PAST SURGICAL HISTORY:  Past Medical History  Diagnosis Date  . Diabetes mellitus without complication   . Schizophrenia     MEDICATIONS:  Prior to Admission medications   Medication Sig Start Date End Date Taking? Authorizing Provider  acetaminophen (TYLENOL) 500 MG tablet Take 500 mg by mouth every 6 (six) hours as needed for mild pain.    Historical Provider, MD  benztropine (COGENTIN) 1 MG tablet Take 1 mg  by mouth 2 (two) times daily.    Historical Provider, MD  clonazePAM (KLONOPIN) 0.5 MG tablet Take 0.5 mg by mouth 3 (three) times daily.    Historical Provider, MD  folic acid (FOLVITE) 1 MG tablet Take 1 mg by mouth daily.    Historical Provider, MD  glimepiride (AMARYL) 1 MG tablet Take 1 mg by mouth daily with breakfast.    Historical Provider, MD  hydrOXYzine (ATARAX/VISTARIL) 25 MG tablet Take 25 mg by mouth every 4 (four) hours as needed for anxiety.    Historical Provider, MD  insulin aspart (NOVOLOG) 100 UNIT/ML injection Inject 2-6 Units into the skin 3 (three) times daily before meals. 15 minutes before meals  190 -230 2 units 231-270 3 units  271-310 4 units 311-350 5 units 351-400 6 units    Historical Provider, MD  linagliptin (TRADJENTA) 5 MG TABS tablet Take 5 mg by mouth daily with breakfast.    Historical Provider, MD  lisinopril (PRINIVIL,ZESTRIL) 20 MG tablet Take 20 mg by mouth daily.    Historical Provider, MD  loratadine (CLARITIN) 10 MG tablet Take 10 mg by mouth daily as needed for allergies.    Historical Provider, MD  meloxicam (MOBIC) 15 MG tablet Take 15 mg by mouth daily.    Historical Provider, MD  Multiple Vitamin (MULTIVITAMIN WITH MINERALS) TABS tablet Take 1 tablet by mouth daily.    Historical Provider, MD  mupirocin ointment (BACTROBAN) 2 % Place 1 application into the nose 2 (  two) times daily.    Historical Provider, MD  nicotine polacrilex (NICORETTE) 4 MG gum Take 4 mg by mouth as needed for smoking cessation.    Historical Provider, MD  OLANZapine (ZYPREXA) 20 MG tablet Take 20 mg by mouth at bedtime.    Historical Provider, MD  OLANZapine (ZYPREXA) 5 MG tablet Take 5 mg by mouth every morning.    Historical Provider, MD  simvastatin (ZOCOR) 40 MG tablet Take 40 mg by mouth daily.    Historical Provider, MD  traZODone (DESYREL) 100 MG tablet Take 100 mg by mouth at bedtime.    Historical Provider, MD    ALLERGIES:  Allergies  Allergen Reactions  .  Clozaril [Clozapine]     Seizure and cardiac arrest  . Haldol [Haloperidol Lactate]     seizures  . Prolixin [Fluphenazine]     seizures    SOCIAL HISTORY:  Social History  Substance Use Topics  . Smoking status: Current Every Day Smoker -- 1.00 packs/day    Types: Cigarettes  . Smokeless tobacco: Not on file  . Alcohol Use: No    FAMILY HISTORY: History reviewed. No pertinent family history.  EXAM: BP 140/90 mmHg  Pulse 94  Temp(Src) 98.1 F (36.7 C) (Oral)  Resp 16  Ht  (1.854 m)  Wt 200 lb (90.719 kg)  BMI 26.39 kg/m2  SpO2 94% CONSTITUTIONAL: Alert and oriented and responds appropriately to questions. Well-appearing; well-nourished, pleasant, responds to questions appropriately HEAD: Normocephalic EYES: Conjunctivae clear, PERRL ENT: normal nose; no rhinorrhea; moist mucous membranes; pharynx without lesions noted NECK: Supple, no meningismus, no LAD; mildly to palpation over the left trapezius muscle but no midline spinal tenderness, step-off or deformity, no hematoma, trachea is midline, no septal cutaneous air  CARD: RRR; S1 and S2 appreciated; no murmurs, no clicks, no rubs, no gallops RESP: Normal chest excursion without splinting or tachypnea; breath sounds clear and equal bilaterally; no wheezes, no rhonchi, no rales, no hypoxia or respiratory distress, speaking full sentences ABD/GI: Normal bowel sounds; non-distended; soft, non-tender, no rebound, no guarding, no peritoneal signs BACK:  The back appears normal and is non-tender to palpation, there is no CVA tenderness EXT: Normal ROM in all joints; non-tender to palpation; no edema; normal capillary refill; no cyanosis, no calf tenderness or swelling    SKIN: Normal color for age and race; warm NEURO: Moves all extremities equally, sensation to light touch intact diffusely, cranial nerves II through XII intact, strength 5/5 in all 4 extremities, normal gait, 2+ bilateral upper and lower deep tendon reflexes,  no clonus PSYCH: The patient's mood and manner are appropriate. Grooming and personal hygiene are appropriate.  MEDICAL DECISION MAKING: Patient here with left trapezius muscle pain. Suspect muscle strain. He states he feels that this is related to his anti-psychotic. No extrapyramidal symptoms. No sign of NMS. No sign of torticollis. He states that Benadryl helps with his symptoms. We'll give him a dose of oral Benadryl and the emergency department. I do not feel he needs any narcotics or other muscle relaxers. Do not feel he needs emergent imaging. He is neurologically intact with no midline tenderness. Doubt spinal stenosis, cervical myelopathy, discitis, epidural abscess or hematoma.  Also requesting that I refill his metformin. States he has been out of this medication for the past 5 days. Reports he takes 500 mg twice a day. His blood glucose years 166. Will refill his metformin but have advised him he needs to follow up with his primary care  physician. States he's been trying to have his PCP see him this week.  I do not feel patient needs further emergent workup. He appears appropriate, pleasant with no psychiatric safety concerns. Discussed return precautions. He verbalizes understanding and is comfort with this plan.       Layla Maw Ward, DO 03/21/15 [phone]

## 2015-03-21 NOTE — ED Notes (Signed)
Called Minus Liberty from [facility] to pick up pt. She stated that she would be coming to get the pt.

## 2015-03-21 NOTE — Discharge Instructions (Signed)
Cervical Strain and Sprain (Whiplash) with Rehab Cervical strain and sprain are injuries that commonly occur with "whiplash" injuries. Whiplash occurs when the neck is forcefully whipped backward or forward, such as during a motor vehicle accident or during contact sports. The muscles, ligaments, tendons, discs, and nerves of the neck are susceptible to injury when this occurs. RISK FACTORS Risk of having a whiplash injury increases if:  Osteoarthritis of the spine.  Situations that make head or neck accidents or trauma more likely.  High-risk sports (football, rugby, wrestling, hockey, auto racing, gymnastics, diving, contact karate, or boxing).  Poor strength and flexibility of the neck.  Previous neck injury.  Poor tackling technique.  Improperly fitted or padded equipment. SYMPTOMS   Pain or stiffness in the front or back of neck or both.  Symptoms may present immediately or up to 24 hours after injury.  Dizziness, headache, nausea, and vomiting.  Muscle spasm with soreness and stiffness in the neck.  Tenderness and swelling at the injury site. PREVENTION  Learn and use proper technique (avoid tackling with the head, spearing, and head-butting; use proper falling techniques to avoid landing on the head).  Warm up and stretch properly before activity.  Maintain physical fitness:  Strength, flexibility, and endurance.  Cardiovascular fitness.  Wear properly fitted and padded protective equipment, such as padded soft collars, for participation in contact sports. PROGNOSIS  Recovery from cervical strain and sprain injuries is dependent on the extent of the injury. These injuries are usually curable in 1 week to 3 months with appropriate treatment.  RELATED COMPLICATIONS   Temporary numbness and weakness may occur if the nerve roots are damaged, and this may persist until the nerve has completely healed.  Chronic pain due to frequent recurrence of  symptoms.  Prolonged healing, especially if activity is resumed too soon (before complete recovery). TREATMENT  Treatment initially involves the use of ice and medication to help reduce pain and inflammation. It is also important to perform strengthening and stretching exercises and modify activities that worsen symptoms so the injury does not get worse. These exercises may be performed at home or with a therapist. For patients who experience severe symptoms, a soft, padded collar may be recommended to be worn around the neck.  Improving your posture may help reduce symptoms. Posture improvement includes pulling your chin and abdomen in while sitting or standing. If you are sitting, sit in a firm chair with your buttocks against the back of the chair. While sleeping, try replacing your pillow with a small towel rolled to 2 inches in diameter, or use a cervical pillow or soft cervical collar. Poor sleeping positions delay healing.  For patients with nerve root damage, which causes numbness or weakness, the use of a cervical traction apparatus may be recommended. Surgery is rarely necessary for these injuries. However, cervical strain and sprains that are present at birth (congenital) may require surgery. MEDICATION   If pain medication is necessary, nonsteroidal anti-inflammatory medications, such as aspirin and ibuprofen, or other minor pain relievers, such as acetaminophen, are often recommended.  Do not take pain medication for 7 days before surgery.  Prescription pain relievers may be given if deemed necessary by your caregiver. Use only as directed and only as much as you need. HEAT AND COLD:   Cold treatment (icing) relieves pain and reduces inflammation. Cold treatment should be applied for 10 to 15 minutes every 2 to 3 hours for inflammation and pain and immediately after any activity that aggravates  your symptoms. Use ice packs or an ice massage.  Heat treatment may be used prior to  performing the stretching and strengthening activities prescribed by your caregiver, physical therapist, or athletic trainer. Use a heat pack or a warm soak. SEEK MEDICAL CARE IF:   Symptoms get worse or do not improve in 2 weeks despite treatment.  New, unexplained symptoms develop (drugs used in treatment may produce side effects).

## 2015-03-21 NOTE — ED Notes (Signed)
Pt discharged to [facility].

## 2015-03-21 NOTE — ED Notes (Signed)
Per EMS pt states that he had an allergic reaction to his Gean Birchwood which he has been on for three years. Per EMS pt states pain is radiating to his neck. Pt states "they will not give me the medication" to counter his "side effects" to his medication. Per pt he usually takes benadryl with his injection.

## 2015-04-02 ENCOUNTER — Emergency Department (HOSPITAL_COMMUNITY)
Admission: EM | Admit: 2015-04-02 | Discharge: 2015-04-04 | Payer: Medicare Other | Attending: Emergency Medicine | Admitting: Emergency Medicine

## 2015-04-02 ENCOUNTER — Encounter (HOSPITAL_COMMUNITY): Payer: Self-pay | Admitting: *Deleted

## 2015-04-02 ENCOUNTER — Emergency Department (HOSPITAL_COMMUNITY): Payer: Medicare Other

## 2015-04-02 DIAGNOSIS — R45850 Homicidal ideations: Secondary | ICD-10-CM | POA: Diagnosis present

## 2015-04-02 DIAGNOSIS — E119 Type 2 diabetes mellitus without complications: Secondary | ICD-10-CM | POA: Insufficient documentation

## 2015-04-02 DIAGNOSIS — F29 Unspecified psychosis not due to a substance or known physiological condition: Secondary | ICD-10-CM | POA: Diagnosis present

## 2015-04-02 DIAGNOSIS — Z720 Tobacco use: Secondary | ICD-10-CM | POA: Diagnosis not present

## 2015-04-02 DIAGNOSIS — F209 Schizophrenia, unspecified: Secondary | ICD-10-CM | POA: Insufficient documentation

## 2015-04-02 DIAGNOSIS — Z79899 Other long term (current) drug therapy: Secondary | ICD-10-CM | POA: Insufficient documentation

## 2015-04-02 LAB — CBG MONITORING, ED: Glucose-Capillary: 173 mg/dL — ABNORMAL HIGH (ref 65–99)

## 2015-04-02 LAB — CBC
HEMATOCRIT: 35.5 % — AB (ref 39.0–52.0)
HEMOGLOBIN: 12.2 g/dL — AB (ref 13.0–17.0)
MCH: 30.7 pg (ref 26.0–34.0)
MCHC: 34.4 g/dL (ref 30.0–36.0)
MCV: 89.4 fL (ref 78.0–100.0)
Platelets: 442 10*3/uL — ABNORMAL HIGH (ref 150–400)
RBC: 3.97 MIL/uL — ABNORMAL LOW (ref 4.22–5.81)
RDW: 12.6 % (ref 11.5–15.5)
WBC: 9.6 10*3/uL (ref 4.0–10.5)

## 2015-04-02 LAB — RAPID URINE DRUG SCREEN, HOSP PERFORMED
AMPHETAMINES: NOT DETECTED
BARBITURATES: NOT DETECTED
Benzodiazepines: NOT DETECTED
Cocaine: NOT DETECTED
Opiates: NOT DETECTED
TETRAHYDROCANNABINOL: NOT DETECTED

## 2015-04-02 LAB — COMPREHENSIVE METABOLIC PANEL
ALBUMIN: 4.4 g/dL (ref 3.5–5.0)
ALT: 16 U/L — ABNORMAL LOW (ref 17–63)
AST: 23 U/L (ref 15–41)
Alkaline Phosphatase: 66 U/L (ref 38–126)
Anion gap: 7 (ref 5–15)
BILIRUBIN TOTAL: 0.2 mg/dL — AB (ref 0.3–1.2)
BUN: 9 mg/dL (ref 6–20)
CHLORIDE: 106 mmol/L (ref 101–111)
CO2: 28 mmol/L (ref 22–32)
Calcium: 9.4 mg/dL (ref 8.9–10.3)
Creatinine, Ser: 0.76 mg/dL (ref 0.61–1.24)
GFR calc Af Amer: >60 mL/min (ref >60)
GFR calc non Af Amer: >60 mL/min (ref >60)
Glucose, Bld: 164 mg/dL — ABNORMAL HIGH (ref 65–99)
POTASSIUM: 3.2 mmol/L — AB (ref 3.5–5.1)
SODIUM: 141 mmol/L (ref 135–145)
TOTAL PROTEIN: 7.6 g/dL (ref 6.5–8.1)

## 2015-04-02 LAB — LITHIUM LEVEL: Lithium Lvl: 0.1 mmol/L — ABNORMAL LOW (ref 0.60–1.20)

## 2015-04-02 LAB — ETHANOL: Alcohol, Ethyl (B): <5 mg/dL (ref <5)

## 2015-04-02 MED ORDER — LORAZEPAM 1 MG PO TABS: 1.0000 mg | ORAL_TABLET | Freq: Three times a day (TID) | ORAL | Status: DC | PRN

## 2015-04-02 MED ORDER — INSULIN ASPART 100 UNIT/ML ~~LOC~~ SOLN: 0.0000 [IU] | Freq: Every day | SUBCUTANEOUS | Status: DC

## 2015-04-02 MED ORDER — PALIPERIDONE PALMITATE 234 MG/1.5ML IM SUSP: 234.0000 mg | INTRAMUSCULAR | Status: DC

## 2015-04-02 MED ORDER — ONDANSETRON HCL 4 MG PO TABS: 4.0000 mg | ORAL_TABLET | Freq: Three times a day (TID) | ORAL | Status: DC | PRN

## 2015-04-02 MED ORDER — TRAZODONE HCL 100 MG PO TABS
100.0000 mg | ORAL_TABLET | Freq: Every day | ORAL | Status: DC
  Administered 2015-04-02 – 2015-04-03 (×2): 100 mg via ORAL
  Filled 2015-04-02 (×2): qty 1

## 2015-04-02 MED ORDER — ACETAMINOPHEN 325 MG PO TABS: 650.0000 mg | ORAL_TABLET | ORAL | Status: DC | PRN

## 2015-04-02 MED ORDER — ATORVASTATIN CALCIUM 20 MG PO TABS
20.0000 mg | ORAL_TABLET | Freq: Every day | ORAL | Status: DC
  Administered 2015-04-02 – 2015-04-03 (×2): 20 mg via ORAL
  Filled 2015-04-02 (×3): qty 1

## 2015-04-02 MED ORDER — BENZTROPINE MESYLATE 1 MG PO TABS
1.0000 mg | ORAL_TABLET | Freq: Two times a day (BID) | ORAL | Status: DC
  Administered 2015-04-02 – 2015-04-04 (×4): 1 mg via ORAL
  Filled 2015-04-02 (×4): qty 1

## 2015-04-02 MED ORDER — NICOTINE 21 MG/24HR TD PT24: 21.0000 mg | MEDICATED_PATCH | Freq: Every day | TRANSDERMAL | Status: DC

## 2015-04-02 MED ORDER — HYDROXYZINE HCL 25 MG PO TABS: 50.0000 mg | ORAL_TABLET | ORAL | Status: DC | PRN

## 2015-04-02 MED ORDER — CLONAZEPAM 0.5 MG PO TABS: 0.5000 mg | ORAL_TABLET | Freq: Two times a day (BID) | ORAL | Status: DC | PRN

## 2015-04-02 MED ORDER — IBUPROFEN 200 MG PO TABS: 600.0000 mg | ORAL_TABLET | Freq: Three times a day (TID) | ORAL | Status: DC | PRN

## 2015-04-02 MED ORDER — INSULIN ASPART 100 UNIT/ML ~~LOC~~ SOLN
3.0000 [IU] | Freq: Three times a day (TID) | SUBCUTANEOUS | Status: DC
  Filled 2015-04-02: qty 1

## 2015-04-02 MED ORDER — GLIMEPIRIDE 1 MG PO TABS
1.0000 mg | ORAL_TABLET | Freq: Every day | ORAL | Status: DC
  Filled 2015-04-02 (×3): qty 1

## 2015-04-02 MED ORDER — QUETIAPINE FUMARATE 25 MG PO TABS: 25.0000 mg | ORAL_TABLET | ORAL | Status: DC | PRN

## 2015-04-02 MED ORDER — LINAGLIPTIN 5 MG PO TABS
5.0000 mg | ORAL_TABLET | Freq: Every day | ORAL | Status: DC
  Filled 2015-04-02 (×3): qty 1

## 2015-04-02 MED ORDER — LITHIUM CARBONATE ER 450 MG PO TBCR
1350.0000 mg | EXTENDED_RELEASE_TABLET | Freq: Every day | ORAL | Status: DC
  Administered 2015-04-02 – 2015-04-03 (×2): 1350 mg via ORAL
  Filled 2015-04-02 (×3): qty 3

## 2015-04-02 MED ORDER — INSULIN ASPART 100 UNIT/ML ~~LOC~~ SOLN
0.0000 [IU] | Freq: Three times a day (TID) | SUBCUTANEOUS | Status: DC
  Filled 2015-04-02: qty 1

## 2015-04-02 MED ORDER — METFORMIN HCL 500 MG PO TABS
500.0000 mg | ORAL_TABLET | Freq: Two times a day (BID) | ORAL | Status: DC
  Administered 2015-04-03 – 2015-04-04 (×3): 500 mg via ORAL
  Filled 2015-04-02 (×5): qty 1

## 2015-04-02 NOTE — ED Notes (Signed)
Pt refused his insulin. States, "that is too much.  i just want my metformin ".

## 2015-04-02 NOTE — BH Assessment (Signed)
Contacted patients guardian Reather Littler at [phone] to inform him of patients visit to the ED. Also contacted the after hours number at [phone] to inform after hours staff of patients status of being IVC'd.  Counselor spoke with Britta Mccreedy for the call who states that she will send information straight to intake.  Davina Poke, LCSW Therapeutic Triage Specialist [facility] 04/02/2015 7:25 PM

## 2015-04-02 NOTE — ED Notes (Signed)
Bed: WA04 Expected date:  Expected time:  Means of arrival:  Comments: t3

## 2015-04-02 NOTE — ED Notes (Signed)
Security and [employer] at bedside. 

## 2015-04-02 NOTE — ED Notes (Signed)
Pt. To [facility] from ED ambulatory without difficulty, to room 36 . Report from [facility]. Pt. Is alert and oriented, warm and dry in no distress. Pt. Denies SI, HI, and AVH. Pt. Calm and cooperative. Pt. Made aware of security cameras and Q15 minute rounds. Pt. Encouraged to let Nursing staff know of any concerns or needs. Sandwich and soft drink given.
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
## 2015-04-02 NOTE — BH Assessment (Addendum)
Contacted Dr. Ellery Plunk per EDP request for additional information. Dr Ellery Plunk, patient Psychiatrist at [phone] for collateral information based on his initial assessment today.  Telephone number was provided by patient caregiver Maren Reamer. Dr. Iantha Fallen Hedding was not available at time of phone call and no message was left at this time.   Davina Poke, LCSW Therapeutic Triage Specialist  [facility] 04/02/2015 6:45 PM

## 2015-04-02 NOTE — ED Notes (Signed)
No lab draw, pt not in room

## 2015-04-02 NOTE — ED Notes (Signed)
Pt refusing to go with radiology for chest xray.

## 2015-04-02 NOTE — ED Provider Notes (Signed)
CSN: [id number]     Arrival date & time 04/02/15  1530 History   First MD Initiated Contact with Patient 04/02/15 1620     Chief Complaint  Patient presents with  . Homicidal     (Consider location/radiation/quality/duration/timing/severity/associated sxs/prior Treatment) HPI Julian [name] is a 59 y.o. male with hx of DM, schizophrenia, presents to ED accompanied by care giver from group home. Pt with extensive psychiatric hx. I had a long discussion with care giver. Apparently pt has delusions but they have been getting much worse. Pt is found wondering out of group home and missing for a day. Once was found 30 miles away, where he walked to "go to the grocery store." pt also was seen jumping in front of cars on the roads. Pt believes that he owns a Doctor, hospital and works for [employer]. Pt requesting to be addressed as Dr. Earlene Plater. 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 Pt was brought here.   Past Medical History  Diagnosis Date  . Diabetes mellitus without complication   . Schizophrenia    History reviewed. No pertinent past surgical history. No family history on file. Social History  Substance Use Topics  . Smoking status: Current Every Day Smoker -- 1.00 packs/day    Types: Cigarettes  . Smokeless tobacco: None  . Alcohol Use: No    Review of Systems  Unable to perform ROS: Psychiatric disorder      Allergies  Clozaril; Haldol; and Prolixin  Home Medications   Prior to Admission medications   Medication Sig Start Date End Date Taking? Authorizing Provider  atorvastatin (LIPITOR) 20 MG tablet Take 20 mg by mouth at bedtime.   Yes Historical Provider, MD  benzocaine (ORAJEL) 10 % mucosal gel Use as directed 1 application in the mouth or throat as needed for mouth pain.   Yes Historical Provider, MD  benztropine (COGENTIN)  1 MG tablet Take 1 mg by mouth 2 (two) times daily.   Yes Historical Provider, MD  glimepiride (AMARYL) 1 MG tablet Take 1 mg by mouth daily with breakfast.   Yes Historical Provider, MD  hydrOXYzine (ATARAX/VISTARIL) 50 MG tablet Take 50 mg by mouth every 4 (four) hours as needed for anxiety (allergies).   Yes Historical Provider, MD  linagliptin (TRADJENTA) 5 MG TABS tablet Take 5 mg by mouth daily with breakfast.   Yes Historical Provider, MD  lithium carbonate (ESKALITH) 450 MG CR tablet Take 1,350 mg by mouth at bedtime.   Yes Historical Provider, MD  metFORMIN (GLUCOPHAGE) 1000 MG tablet Take 0.5 tablets (500 mg total) by mouth 2 (two) times daily with a meal. 03/21/15  Yes Kristen N Ward, DO  Multiple Vitamin (MULTIVITAMIN WITH MINERALS) TABS tablet Take 1 tablet by mouth daily.   Yes Historical Provider, MD  paliperidone (INVEGA SUSTENNA) 234 MG/1.5ML SUSP injection Inject 234 mg into the muscle every 30 (thirty) days.   Yes Historical Provider, MD  QUEtiapine (SEROQUEL) 25 MG tablet Take 25 mg by mouth every 4 (four) hours as needed (agitation).   Yes Historical Provider, MD  traZODone (DESYREL) 100 MG tablet Take 100 mg by mouth at bedtime.   Yes Historical Provider, MD  benztropine (COGENTIN) 2 MG tablet Take 2 mg by mouth 2 (two) times daily.    Historical Provider, MD  clonazePAM (  KLONOPIN) 0.5 MG tablet Take 0.5 mg by mouth every 12 (twelve) hours as needed (agitation).     Historical Provider, MD  diphenhydrAMINE (BENADRYL) 25 MG tablet Take 1 tablet (25 mg total) by mouth every 6 (six) hours as needed for sleep (neck pain). Patient not taking: Reported on 04/02/2015 03/21/15   Kristen N Ward, DO   BP 147/77 mmHg  Pulse 115  Temp(Src) 98.6 F (37 C) (Oral)  Resp 18  Ht 6' (1.829 m)  Wt 200 lb (90.719 kg)  BMI 27.12 kg/m2  SpO2 94% Physical Exam  Constitutional: He is oriented to person, place, and time. He appears well-developed and well-nourished. No distress.  HENT:  Head:  Normocephalic and atraumatic.  Eyes: Conjunctivae are normal.  Neck: Neck supple.  Cardiovascular: Normal rate, regular rhythm and normal heart sounds.   Pulmonary/Chest: Effort normal. No respiratory distress. He has no wheezes. He has no rales.  Musculoskeletal: He exhibits no edema.  Neurological: He is alert and oriented to person, place, and time.  Skin: Skin is warm and dry.  Psychiatric: His affect is labile. He is agitated and actively hallucinating. Thought content is delusional. Cognition and memory are impaired. He expresses inappropriate judgment. He expresses homicidal ideation.  Nursing note and vitals reviewed.   ED Course  Procedures (including critical care time) Labs Review Labs Reviewed  COMPREHENSIVE METABOLIC PANEL - Abnormal; Notable for the following:    Potassium 3.2 (*)    Glucose, Bld 164 (*)    ALT 16 (*)    Total Bilirubin 0.2 (*)    All other components within normal limits  CBC - Abnormal; Notable for the following:    RBC 3.97 (*)    Hemoglobin 12.2 (*)    HCT 35.5 (*)    Platelets 442 (*)    All other components within normal limits  LITHIUM LEVEL - Abnormal; Notable for the following:    Lithium Lvl 0.10 (*)    All other components within normal limits  CBG MONITORING, ED - Abnormal; Notable for the following:    Glucose-Capillary 173 (*)    All other components within normal limits  CBG MONITORING, ED - Abnormal; Notable for the following:    Glucose-Capillary 122 (*)    All other components within normal limits  CBG MONITORING, ED - Abnormal; Notable for the following:    Glucose-Capillary 107 (*)    All other components within normal limits  CBG MONITORING, ED - Abnormal; Notable for the following:    Glucose-Capillary 111 (*)    All other components within normal limits  CBG MONITORING, ED - Abnormal; Notable for the following:    Glucose-Capillary 119 (*)    All other components within normal limits  ETHANOL  URINE RAPID DRUG  SCREEN, HOSP PERFORMED    Imaging Review No results found. I have personally reviewed and evaluated these images and lab results as part of my medical decision-making.   EKG Interpretation None      MDM   Final diagnoses:  None    Pt seen and IVCed by [place] given danger to himself and others. Will get TTS involved. Will get labs.  TTS assessed. Will monitor over night. Medically cleared. Refuses ECG.        Jaynie Crumble, PA-C 04/04/15 2131  Azalia Bilis, MD 04/05/15 [phone]
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
## 2015-04-02 NOTE — ED Notes (Signed)
Pt refused to get his vitals taken

## 2015-04-02 NOTE — ED Notes (Signed)
Pt. Noted sleeping in room. No complaints or concerns voiced. No distress or abnormal behavior noted. Will continue to monitor with security cameras. Q 15 minute rounds continue. 

## 2015-04-02 NOTE — ED Notes (Signed)
Per PA, EKG can wait.  It's ok to move patient to [facility]

## 2015-04-02 NOTE — ED Notes (Signed)
Patient belongings is in locker 48
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
## 2015-04-02 NOTE — ED Notes (Signed)
Patient instructed writer to leave him alone hew was sleeping was unable to obtain vitals RN Jillyn Hidden made aware.

## 2015-04-02 NOTE — ED Notes (Signed)
Pt is refusing an EKG, states that he just had one done

## 2015-04-02 NOTE — ED Notes (Signed)
Lab has enough blood in lab to collect for Lithium level

## 2015-04-02 NOTE — BH Assessment (Signed)
Consulted with Nanine Means, DNP who recommends patient be observed overnight and evaluated by psychiatry in the morning, informed EDP of disposition.   Davina Poke, LCSW Therapeutic Triage Specialist [facility] 04/02/2015 6:39 PM

## 2015-04-03 ENCOUNTER — Emergency Department (HOSPITAL_COMMUNITY): Payer: Medicare Other

## 2015-04-03 DIAGNOSIS — Z720 Tobacco use: Secondary | ICD-10-CM | POA: Diagnosis not present

## 2015-04-03 DIAGNOSIS — E119 Type 2 diabetes mellitus without complications: Secondary | ICD-10-CM | POA: Diagnosis not present

## 2015-04-03 DIAGNOSIS — F209 Schizophrenia, unspecified: Secondary | ICD-10-CM | POA: Diagnosis not present

## 2015-04-03 DIAGNOSIS — F29 Unspecified psychosis not due to a substance or known physiological condition: Secondary | ICD-10-CM

## 2015-04-03 DIAGNOSIS — R45850 Homicidal ideations: Secondary | ICD-10-CM | POA: Diagnosis present

## 2015-04-03 DIAGNOSIS — Z79899 Other long term (current) drug therapy: Secondary | ICD-10-CM | POA: Diagnosis not present

## 2015-04-03 LAB — CBG MONITORING, ED
GLUCOSE-CAPILLARY: 107 mg/dL — AB (ref 65–99)
Glucose-Capillary: 122 mg/dL — ABNORMAL HIGH (ref 65–99)
Glucose-Capillary: 111 mg/dL — ABNORMAL HIGH (ref 65–99)

## 2015-04-03 MED ORDER — NICOTINE POLACRILEX 2 MG MT GUM
2.0000 mg | CHEWING_GUM | OROMUCOSAL | Status: DC | PRN
  Administered 2015-04-03 (×2): 2 mg via ORAL
  Filled 2015-04-03 (×2): qty 1

## 2015-04-03 MED ORDER — PALIPERIDONE ER 3 MG PO TB24
3.0000 mg | ORAL_TABLET | Freq: Every day | ORAL | Status: DC
  Filled 2015-04-03 (×2): qty 1

## 2015-04-03 NOTE — ED Notes (Signed)
Pt. Noted sleeping in room. No complaints or concerns voiced. No distress or abnormal behavior noted. Will continue to monitor with security cameras. Q 15 minute rounds continue. 

## 2015-04-03 NOTE — ED Notes (Signed)
X ray in to transport to x ray.  Pt declined x ray

## 2015-04-03 NOTE — Progress Notes (Signed)
Disposition CSW completed patient referrals to the following Inpatient Geropsychiatry facilities:  [facility] [address]. Tory Emerald  CSW will follow patient for placement needs.  Seward Speck [facility] Disposition CSW [phone]

## 2015-04-03 NOTE — ED Notes (Signed)
Up to the bathroom 

## 2015-04-03 NOTE — ED Notes (Signed)
TTS into talk w/pt.  Pt still declining EKG and CXR

## 2015-04-03 NOTE — ED Notes (Signed)
Pt. Noted in room. No complaints or concerns voiced. No distress or abnormal behavior noted. Will continue to monitor with security cameras. Q 15 minute rounds continue. 

## 2015-04-03 NOTE — ED Notes (Signed)
Josephine NP into see 

## 2015-04-03 NOTE — Progress Notes (Signed)
Julieanne Cotton, NP states that patient would benefit from geri psych placement if patient is agreeable. NP to talk to patient about this recommendation.   Janann Colonel. MSW, LCSW Therapeutic Triage Services-Triage Specialist   Phone: [phone]

## 2015-04-03 NOTE — ED Notes (Signed)
Report received from Janie Rambo RN. Pt. Sleeping, respirations regular and unlabored. Will continue to monitor for safety via security cameras and Q 15 minute checks. 

## 2015-04-03 NOTE — ED Notes (Signed)
Pt. Refused CBG and insulin.

## 2015-04-03 NOTE — ED Notes (Signed)
Pt resting quielty, nad. Pt sts that he wants to leave the [place] and go to [place].

## 2015-04-03 NOTE — ED Notes (Signed)
Dr ross and josephine np into see 

## 2015-04-03 NOTE — ED Notes (Signed)
Up on the phone 

## 2015-04-03 NOTE — ED Notes (Addendum)
Pt. Noted sleeping in room. No complaints or concerns voiced. No distress or abnormal behavior noted. Will continue to monitor with security cameras. Q 15 minute rounds continue. 

## 2015-04-03 NOTE — ED Notes (Signed)
Patient is refusing vitals.  

## 2015-04-03 NOTE — ED Notes (Signed)
Pt instructed not to lay down with the nicotine gum in his mouth, pt verbalized understanding

## 2015-04-03 NOTE — Progress Notes (Signed)
11:22am. Psych team requested that CSW call pt's group home for collateral.  Per medical record, pt resides at [facility] in [place]. CSW looked up number for facility on state website [phone])--attempted and number disconnected. CSW also attempted pt's nurse, Burna Mortimer, at [phone] one picked up and unable to leave message. CSW attempted pt's guardian, Reather Littler [phone]). However, guardian does not appear to be in on weekend. No message left at this time.   York Spaniel [facility] Clinical Social Worker [facility] phone: [phone]
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
## 2015-04-03 NOTE — ED Notes (Signed)
Josephine NP aware that pt will not take the invega PO. Pt stated that he takes the shots and  wants to continue with the shots.  Pt also had requested his physics book to read, book removed from belongings/searched and given to pt.

## 2015-04-03 NOTE — ED Notes (Addendum)
Josephine NP into talk w/ pt.  Pt continues to decline EKG and CXR

## 2015-04-03 NOTE — ED Notes (Signed)
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

## 2015-04-04 DIAGNOSIS — F29 Unspecified psychosis not due to a substance or known physiological condition: Secondary | ICD-10-CM | POA: Diagnosis not present

## 2015-04-04 LAB — CBG MONITORING, ED: GLUCOSE-CAPILLARY: 119 mg/dL — AB (ref 65–99)

## 2015-04-04 NOTE — ED Notes (Addendum)
Pt's mother contacted and is aware of his transfer, contact information given, sheriff is here to transport. --Mother's contact number--[phone]

## 2015-04-04 NOTE — Progress Notes (Signed)
Clinician spoke with Julian [name] after hours clinician-Adrienne Sharol Harness who returned phone call that [place] Therapist, occupational) made last night. Clinician informed Sharol Harness of patient being accepted to [facility] providing address and phone number. Sharol Harness stated that she will pass message along to patient's guardian [place] [facility].    Janann Colonel. MSW, LCSW Therapeutic Triage Services-Triage Specialist   Phone: [phone]

## 2015-04-04 NOTE — ED Notes (Signed)
Pt. Noted sleeping in room. No complaints or concerns voiced. No distress or abnormal behavior noted. Will continue to monitor with security cameras. Q 15 minute rounds continue. 

## 2015-04-04 NOTE — BH Assessment (Addendum)
Spoke with Marena Chancy from [facility] who reported that pt has been accepted to [facility] by Dr. Roselle Locus. Report can be called to [phone]. Pt can transported after 8 am. Pt's nurse has been informed of the disposition.

## 2015-04-04 NOTE — ED Notes (Signed)
Up on the phone 

## 2015-04-04 NOTE — ED Notes (Addendum)
Verbal permission from pt to release information to his mother-Vanessa Mayford Knife.  She called and reported that she lives in [place] and did not know anything about what had happened.;  Pt is aware that he is being transferred.

## 2015-04-04 NOTE — BH Assessment (Signed)
Contacted the after hours number and spoke with Carollee Herter. Requested that [facility] relay a message to pt's guardian regarding pt's disposition. This writer left her name and callback number as well as the name of the oncoming counselor.

## 2015-04-04 NOTE — ED Notes (Signed)
Pt on the phone talking with his mother

## 2015-04-04 NOTE — ED Notes (Addendum)
Pt ambulatory w/o difficulty to [place] w/ sheriff.  EMTELA, IVC papers, transfer report, assessment notes, MAR report, face sheet, belongings and mother contact information sent w/ sheriff. [place] RN at [facility] updated and is aware pt is in transit.

## 2015-04-04 NOTE — ED Notes (Signed)
Up in the hall talking about the election and voting, pleasant, redirectable

## 2015-06-13 ENCOUNTER — Encounter (HOSPITAL_COMMUNITY): Payer: Self-pay | Admitting: Nurse Practitioner

## 2015-06-13 ENCOUNTER — Observation Stay (HOSPITAL_COMMUNITY)
Admission: EM | Admit: 2015-06-13 | Discharge: 2015-06-16 | Payer: Medicare Other | Attending: Emergency Medicine | Admitting: Emergency Medicine

## 2015-06-13 DIAGNOSIS — E876 Hypokalemia: Secondary | ICD-10-CM | POA: Diagnosis not present

## 2015-06-13 DIAGNOSIS — F17210 Nicotine dependence, cigarettes, uncomplicated: Secondary | ICD-10-CM | POA: Diagnosis not present

## 2015-06-13 DIAGNOSIS — R4182 Altered mental status, unspecified: Secondary | ICD-10-CM | POA: Insufficient documentation

## 2015-06-13 DIAGNOSIS — F200 Paranoid schizophrenia: Principal | ICD-10-CM | POA: Insufficient documentation

## 2015-06-13 DIAGNOSIS — E119 Type 2 diabetes mellitus without complications: Secondary | ICD-10-CM | POA: Diagnosis not present

## 2015-06-13 DIAGNOSIS — Z79899 Other long term (current) drug therapy: Secondary | ICD-10-CM | POA: Diagnosis not present

## 2015-06-13 DIAGNOSIS — F209 Schizophrenia, unspecified: Secondary | ICD-10-CM

## 2015-06-13 DIAGNOSIS — Z7984 Long term (current) use of oral hypoglycemic drugs: Secondary | ICD-10-CM | POA: Insufficient documentation

## 2015-06-13 DIAGNOSIS — Z049 Encounter for examination and observation for unspecified reason: Secondary | ICD-10-CM

## 2015-06-13 LAB — URINALYSIS, ROUTINE W REFLEX MICROSCOPIC
Bilirubin Urine: NEGATIVE
Glucose, UA: 250 mg/dL — AB
Hgb urine dipstick: NEGATIVE
Ketones, ur: 15 mg/dL — AB
Leukocytes, UA: NEGATIVE
Nitrite: NEGATIVE
Protein, ur: NEGATIVE mg/dL
Specific Gravity, Urine: 1.024 (ref 1.005–1.030)
pH: 5.5 (ref 5.0–8.0)

## 2015-06-13 LAB — CBC
HCT: 37.6 % — ABNORMAL LOW (ref 39.0–52.0)
Hemoglobin: 12.6 g/dL — ABNORMAL LOW (ref 13.0–17.0)
MCH: 30.7 pg (ref 26.0–34.0)
MCHC: 33.5 g/dL (ref 30.0–36.0)
MCV: 91.5 fL (ref 78.0–100.0)
Platelets: 356 10*3/uL (ref 150–400)
RBC: 4.11 MIL/uL — ABNORMAL LOW (ref 4.22–5.81)
RDW: 13.3 % (ref 11.5–15.5)
WBC: 9.8 10*3/uL (ref 4.0–10.5)

## 2015-06-13 LAB — COMPREHENSIVE METABOLIC PANEL
ALT: 20 U/L (ref 17–63)
AST: 32 U/L (ref 15–41)
Albumin: 4.1 g/dL (ref 3.5–5.0)
Alkaline Phosphatase: 79 U/L (ref 38–126)
Anion gap: 12 (ref 5–15)
BUN: 14 mg/dL (ref 6–20)
CO2: 25 mmol/L (ref 22–32)
Calcium: 9.1 mg/dL (ref 8.9–10.3)
Chloride: 104 mmol/L (ref 101–111)
Creatinine, Ser: 0.63 mg/dL (ref 0.61–1.24)
GFR calc Af Amer: >60 mL/min (ref >60)
GFR calc non Af Amer: >60 mL/min (ref >60)
Glucose, Bld: 103 mg/dL — ABNORMAL HIGH (ref 65–99)
Potassium: 3 mmol/L — ABNORMAL LOW (ref 3.5–5.1)
Sodium: 141 mmol/L (ref 135–145)
Total Bilirubin: 0.6 mg/dL (ref 0.3–1.2)
Total Protein: 7.5 g/dL (ref 6.5–8.1)

## 2015-06-13 LAB — RAPID URINE DRUG SCREEN, HOSP PERFORMED
Amphetamines: NOT DETECTED
Barbiturates: NOT DETECTED
Benzodiazepines: NOT DETECTED
Cocaine: NOT DETECTED
Opiates: NOT DETECTED
Tetrahydrocannabinol: NOT DETECTED

## 2015-06-13 LAB — CBG MONITORING, ED: Glucose-Capillary: 110 mg/dL — ABNORMAL HIGH (ref 65–99)

## 2015-06-13 LAB — LITHIUM LEVEL: Lithium Lvl: 0.08 mmol/L — ABNORMAL LOW (ref 0.60–1.20)

## 2015-06-13 LAB — AMMONIA: Ammonia: 42 umol/L — ABNORMAL HIGH (ref 9–35)

## 2015-06-13 MED ORDER — LORAZEPAM 2 MG/ML IJ SOLN
INTRAMUSCULAR | Status: AC
  Administered 2015-06-13: 2 mg via INTRAMUSCULAR
  Filled 2015-06-13: qty 1

## 2015-06-13 MED ORDER — LORAZEPAM 2 MG/ML IJ SOLN
2.0000 mg | Freq: Once | INTRAMUSCULAR | Status: AC
  Administered 2015-06-13: 2 mg via INTRAMUSCULAR

## 2015-06-13 MED ORDER — POTASSIUM CHLORIDE CRYS ER 20 MEQ PO TBCR
40.0000 meq | EXTENDED_RELEASE_TABLET | Freq: Once | ORAL | Status: AC
  Administered 2015-06-13: 40 meq via ORAL
  Filled 2015-06-13: qty 2
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
## 2015-06-13 NOTE — ED Notes (Signed)
[facility] Expected date:  Expected time:  Means of arrival:  Comments: EMS-triage lithium levels off
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
## 2015-06-13 NOTE — ED Notes (Signed)
Patient called 911 complaining that his lithium levels were not adjusted correctly. 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 He refused treatment in the ambulance.

## 2015-06-14 ENCOUNTER — Observation Stay (HOSPITAL_COMMUNITY): Payer: Medicare Other

## 2015-06-14 DIAGNOSIS — F200 Paranoid schizophrenia: Secondary | ICD-10-CM | POA: Diagnosis not present

## 2015-06-14 MED ORDER — METFORMIN HCL 500 MG PO TABS
500.0000 mg | ORAL_TABLET | Freq: Two times a day (BID) | ORAL | Status: DC
  Administered 2015-06-14 – 2015-06-16 (×6): 500 mg via ORAL
  Filled 2015-06-14 (×8): qty 1

## 2015-06-14 MED ORDER — ATORVASTATIN CALCIUM 20 MG PO TABS
20.0000 mg | ORAL_TABLET | Freq: Every day | ORAL | Status: DC
  Administered 2015-06-14: 20 mg via ORAL
  Filled 2015-06-14 (×4): qty 1

## 2015-06-14 MED ORDER — AMLODIPINE BESYLATE 5 MG PO TABS
5.0000 mg | ORAL_TABLET | Freq: Every day | ORAL | Status: DC
  Administered 2015-06-14: 5 mg via ORAL
  Filled 2015-06-14 (×4): qty 1

## 2015-06-14 MED ORDER — BENZTROPINE MESYLATE 1 MG PO TABS
1.0000 mg | ORAL_TABLET | Freq: Two times a day (BID) | ORAL | Status: DC
  Administered 2015-06-14 – 2015-06-16 (×4): 1 mg via ORAL
  Filled 2015-06-14 (×7): qty 1

## 2015-06-14 MED ORDER — LITHIUM CARBONATE ER 450 MG PO TBCR
1350.0000 mg | EXTENDED_RELEASE_TABLET | Freq: Every day | ORAL | Status: DC
  Filled 2015-06-14 (×2): qty 3

## 2015-06-14 MED ORDER — HYDROXYZINE HCL 25 MG PO TABS
25.0000 mg | ORAL_TABLET | Freq: Three times a day (TID) | ORAL | Status: DC | PRN
  Administered 2015-06-16: 25 mg via ORAL
  Filled 2015-06-14: qty 1

## 2015-06-14 MED ORDER — CLONAZEPAM 0.5 MG PO TABS
0.5000 mg | ORAL_TABLET | Freq: Two times a day (BID) | ORAL | Status: DC | PRN
  Filled 2015-06-14: qty 1

## 2015-06-14 MED ORDER — QUETIAPINE FUMARATE 25 MG PO TABS: 25.0000 mg | ORAL_TABLET | ORAL | Status: DC | PRN

## 2015-06-14 MED ORDER — LITHIUM CARBONATE ER 450 MG PO TBCR
450.0000 mg | EXTENDED_RELEASE_TABLET | Freq: Two times a day (BID) | ORAL | Status: DC
  Administered 2015-06-14 – 2015-06-16 (×5): 450 mg via ORAL
  Filled 2015-06-14 (×7): qty 1

## 2015-06-14 MED ORDER — TRAZODONE HCL 100 MG PO TABS
100.0000 mg | ORAL_TABLET | Freq: Every day | ORAL | Status: DC
  Administered 2015-06-14 – 2015-06-15 (×2): 100 mg via ORAL
  Filled 2015-06-14 (×2): qty 1

## 2015-06-14 MED ORDER — METFORMIN HCL 500 MG PO TABS: 500.0000 mg | ORAL_TABLET | Freq: Two times a day (BID) | ORAL | Status: DC

## 2015-06-14 MED ORDER — PALIPERIDONE ER 6 MG PO TB24
6.0000 mg | ORAL_TABLET | Freq: Every day | ORAL | Status: DC
  Administered 2015-06-14 – 2015-06-16 (×3): 6 mg via ORAL
  Filled 2015-06-14 (×7): qty 1

## 2015-06-14 MED ORDER — QUETIAPINE FUMARATE 50 MG PO TABS: 50.0000 mg | ORAL_TABLET | Freq: Every day | ORAL | Status: DC

## 2015-06-14 NOTE — ED Notes (Signed)
Bed: [id number] Expected date:  Expected time:  Means of arrival:  Comments: Move room 23

## 2015-06-14 NOTE — ED Notes (Addendum)
Pt walked out of room with sitter after being told multiple times to stay in room. Food cart was in hall as dietary was getting trays out. Pt picked up magnet and after being told to put magnet down, pt put magnet in mouth and swallowed. MD and CN made aware.

## 2015-06-14 NOTE — ED Notes (Signed)
Pt resting quietly in room, NAD. Sitter at bedside. Pt denies any needs at the moment.   

## 2015-06-14 NOTE — Consult Note (Signed)
[facility] Psychiatry Consult   Reason for Consult:  Delusional disorder Referring Physician:  EDP Patient Identification: Julian [name] MRN:  [id number] Principal Diagnosis: Schizophrenia, paranoid [facility]) Diagnosis:   Patient Active Problem List   Diagnosis Date Noted  . Schizophrenia, paranoid ([place]) [F20.0] 06/14/2015    Priority: High    Total Time spent with patient: 45 minutes  Subjective:   Julian [name] is a 59 y.o. male patient admitted with delusions, psychosis.  HPI:  On admission:  59 y.o. male, single, African-American who presents unaccompanied to [facility] ED after calling EMS. Pt was angry, insisted on leaving the ED AMA and had to be escorted back to his room by ED staff and law enforcement. Pt has a history of schizophrenia and delusional disorder. He states he needs to have his lithium level checked and that "the doctors are incompetent." He says he has a PhD and has to go to court because he is suing the government, [place] [employer] and the state of [place]. 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 Pt also makes accusations that people in the ED are raping him. Pt is unable to answer questions for assessment. When asked how staff can help he responds "mutual assured destruction." Pt doesn't respond to most questions and stares blankly, appearing preoccupied. Pt attempted to call 911 on his cell phone from the ED but didn't appear to be operating the phone properly.  Okey Regal, PA-C states he contacted Pt's mother, who resides in [place], and she reports he left his group home in [place] and arrived at her home. She states she tried to contact his group home, the name of which was unavailable, for three days and could not reach them. Pt's chart indicates his last group home was [facility]. This [place] contacted  Isaias Sakai [phone] who said Pt last resided with them in September and she does not know where he is currently residing. Ms Hardin Negus confirmed Pt's guardian is Theora Gianotti with [facility] [phone]. Unable to reach [facility] after hours.  Today:  Patient continues to be disorganized and delusional.  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  [place] is not showering or caring for himself but willing to shower.    Past Psychiatric History: schizophrenia   Risk to Self: Suicidal Ideation: No Suicidal Intent: No (Unable to assess) Is patient at risk for suicide?: No Access to Means: No (Unable to assess) What has been your use of drugs/alcohol within the last 12 months?: Unable to assess How many times?: 0 (Unable to assess) Other Self Harm Risks: Pt appear psychotic and unable to care for himself Triggers for Past Attempts: None known Intentional Self Injurious Behavior: None Risk to Others: Homicidal Ideation: No Thoughts of Harm to Others: No (Unable to assess) Current Homicidal Intent: No (Unable to assess) Current Homicidal Plan: No (Unable to assess) Access to Homicidal Means: No (Unable to assess) Identified Victim: None History of harm to others?: No (Unable to assess) Assessment of Violence: None Noted Violent Behavior Description: No documented history of violence Does patient have access to weapons?: No (Unable to assess) Criminal Charges Pending?: No (Unable to assess) Does patient have a court date: No (Unable to assess) Prior Inpatient Therapy: Prior Inpatient Therapy:  Yes Prior Therapy Dates: 03/2015, multiple admits Prior Therapy Facilty/Provider(s): [facility], multiple facilities Reason for Treatment: Schizophrenia Prior Outpatient Therapy: Prior Outpatient Therapy: Yes Prior Therapy Dates: 2016 Prior Therapy Facilty/Provider(s): Dr. Dolores Frame Reason for Treatment:  Schizophrenia Does patient have an ACCT team?: No (Pt has had ACTT in the past) Does patient have Intensive In-House Services?  : No Does patient have [facility] services? : No Does patient have P4CC services?: No  Past Medical History:  Past Medical History  Diagnosis Date  . Diabetes mellitus without complication (HCC)   . Schizophrenia (HCC)    History reviewed. No pertinent past surgical history. Family History: History reviewed. No pertinent family history. Family Psychiatric  History: Unknown Social History:  History  Alcohol Use No     History  Drug Use No    Social History   Social History  . Marital Status: Married    Spouse Name: N/A  . Number of Children: N/A  . Years of Education: N/A   Social History Main Topics  . Smoking status: Current Every Day Smoker -- 1.00 packs/day    Types: Cigarettes  . Smokeless tobacco: None  . Alcohol Use: No  . Drug Use: No  . Sexual Activity: No   Other Topics Concern  . None   Social History Narrative   Additional Social History:    Pain Medications: No documented history of abuse Prescriptions: No documented history of abuse Over the Counter: No documented history of abuse History of alcohol / drug use?: No history of alcohol / drug abuse Longest period of sobriety (when/how long): NA                     Allergies:   Allergies  Allergen Reactions  . Clozaril [Clozapine]     Seizure and cardiac arrest  . Haldol [Haloperidol Lactate]     seizures  . Prolixin [Fluphenazine]     seizures    Labs:  Results for orders placed or performed during the hospital encounter of 06/13/15 (from the past 48 hour(s))  Comprehensive metabolic panel     Status: Abnormal   Collection Time: 06/13/15  8:38 PM  Result Value Ref Range   Sodium 141 135 - 145 mmol/L   Potassium 3.0 (L) 3.5 - 5.1 mmol/L   Chloride 104 101 - 111 mmol/L   CO2 25 22 - 32 mmol/L   Glucose, Bld 103 (H) 65 - 99 mg/dL   BUN 14 6 - 20 mg/dL    Creatinine, Ser 5.36 0.61 - 1.24 mg/dL   Calcium 9.1 8.9 - 76.1 mg/dL   Total Protein 7.5 6.5 - 8.1 g/dL   Albumin 4.1 3.5 - 5.0 g/dL   AST 32 15 - 41 U/L   ALT 20 17 - 63 U/L   Alkaline Phosphatase 79 38 - 126 U/L   Total Bilirubin 0.6 0.3 - 1.2 mg/dL   GFR calc non Af Amer >60 >60 mL/min   GFR calc Af Amer >60 >60 mL/min    Comment: (NOTE) The eGFR has been calculated using the CKD EPI equation. This calculation has not been validated in all clinical situations. eGFR's persistently <60 mL/min signify possible Chronic Kidney Disease.    Anion gap 12 5 - 15  CBC     Status: Abnormal   Collection Time: 06/13/15  8:38 PM  Result Value Ref Range   WBC 9.8 4.0 - 10.5 K/uL   RBC 4.11 (L) 4.22 - 5.81 MIL/uL  Hemoglobin 12.6 (L) 13.0 - 17.0 g/dL   HCT 37.6 (L) 39.0 - 52.0 %   MCV 91.5 78.0 - 100.0 fL   MCH 30.7 26.0 - 34.0 pg   MCHC 33.5 30.0 - 36.0 g/dL   RDW 13.3 11.5 - 15.5 %   Platelets 356 150 - 400 K/uL  Ammonia     Status: Abnormal   Collection Time: 06/13/15  8:38 PM  Result Value Ref Range   Ammonia 42 (H) 9 - 35 umol/L  Lithium level     Status: Abnormal   Collection Time: 06/13/15  8:38 PM  Result Value Ref Range   Lithium Lvl 0.08 (L) 0.60 - 1.20 mmol/L  Urinalysis, Routine w reflex microscopic (not at [facility])     Status: Abnormal   Collection Time: 06/13/15  8:53 PM  Result Value Ref Range   Color, Urine YELLOW YELLOW   APPearance CLEAR CLEAR   Specific Gravity, Urine 1.024 1.005 - 1.030   pH 5.5 5.0 - 8.0   Glucose, UA 250 (A) NEGATIVE mg/dL   Hgb urine dipstick NEGATIVE NEGATIVE   Bilirubin Urine NEGATIVE NEGATIVE   Ketones, ur 15 (A) NEGATIVE mg/dL   Protein, ur NEGATIVE NEGATIVE mg/dL   Nitrite NEGATIVE NEGATIVE   Leukocytes, UA NEGATIVE NEGATIVE    Comment: MICROSCOPIC NOT DONE ON URINES WITH NEGATIVE PROTEIN, BLOOD, LEUKOCYTES, NITRITE, OR GLUCOSE <1000 mg/dL.  Urine rapid drug screen (hosp performed)     Status: None   Collection Time: 06/13/15  8:53  PM  Result Value Ref Range   Opiates NONE DETECTED NONE DETECTED   Cocaine NONE DETECTED NONE DETECTED   Benzodiazepines NONE DETECTED NONE DETECTED   Amphetamines NONE DETECTED NONE DETECTED   Tetrahydrocannabinol NONE DETECTED NONE DETECTED   Barbiturates NONE DETECTED NONE DETECTED    Comment:        DRUG SCREEN FOR MEDICAL PURPOSES ONLY.  IF CONFIRMATION IS NEEDED FOR ANY PURPOSE, NOTIFY LAB WITHIN 5 DAYS.        LOWEST DETECTABLE LIMITS FOR URINE DRUG SCREEN Drug Class       Cutoff (ng/mL) Amphetamine      1000 Barbiturate      200 Benzodiazepine   712 Tricyclics       458 Opiates          300 Cocaine          300 THC              50   CBG monitoring, ED     Status: Abnormal   Collection Time: 06/13/15  8:59 PM  Result Value Ref Range   Glucose-Capillary 110 (H) 65 - 99 mg/dL    Current Facility-Administered Medications  Medication Dose Route Frequency Provider Last Rate Last Dose  . amLODipine (NORVASC) tablet 5 mg  5 mg Oral Daily Drayk Humbarger   5 mg at 06/14/15 1402  . atorvastatin (LIPITOR) tablet 20 mg  20 mg Oral QHS Grissel Tyrell      . benztropine (COGENTIN) tablet 1 mg  1 mg Oral BID Okey Regal, PA-C   1 mg at 06/14/15 1002  . clonazePAM (KLONOPIN) tablet 0.5 mg  0.5 mg Oral Q12H PRN Okey Regal, PA-C      . hydrOXYzine (ATARAX/VISTARIL) tablet 25 mg  25 mg Oral TID PRN Theus Espin      . lithium carbonate (ESKALITH) CR tablet 450 mg  450 mg Oral BID PC Dustin Bumbaugh      . metFORMIN (GLUCOPHAGE) tablet  500 mg  500 mg Oral BID WC Okey Regal, PA-C   500 mg at 06/14/15 1002  . paliperidone (INVEGA) 24 hr tablet 6 mg  6 mg Oral Daily Denee Boeder   6 mg at 06/14/15 1401  . traZODone (DESYREL) tablet 100 mg  100 mg Oral QHS Sierrah Luevano       Current Outpatient Prescriptions  Medication Sig Dispense Refill  . amLODipine (NORVASC) 5 MG tablet Take 5 mg by mouth daily.    Marland Kitchen atorvastatin (LIPITOR) 20 MG tablet Take 20 mg by mouth at  bedtime.    . benztropine (COGENTIN) 2 MG tablet Take 2 mg by mouth 2 (two) times daily.    . diphenhydrAMINE (BENADRYL) 25 MG tablet Take 1 tablet (25 mg total) by mouth every 6 (six) hours as needed for sleep (neck pain). (Patient taking differently: Take 50 mg by mouth 2 (two) times daily. ) 20 tablet 0  . lithium carbonate (ESKALITH) 450 MG CR tablet Take 450 mg by mouth 2 (two) times daily.     . metFORMIN (GLUCOPHAGE) 500 MG tablet Take 500 mg by mouth 2 (two) times daily.    . paliperidone (INVEGA SUSTENNA) 234 MG/1.5ML SUSP injection Inject 234 mg into the muscle every 30 (thirty) days.    . paliperidone (INVEGA) 3 MG 24 hr tablet Take 3 mg by mouth at bedtime.    . traZODone (DESYREL) 100 MG tablet Take 100 mg by mouth at bedtime.      Musculoskeletal: Strength & Muscle Tone: within normal limits Gait & Station: normal Patient leans: N/A  Psychiatric Specialty Exam: Review of Systems  Constitutional: Negative.   HENT: Negative.   Eyes: Negative.   Respiratory: Negative.   Cardiovascular: Negative.   Gastrointestinal: Negative.   Genitourinary: Negative.   Musculoskeletal: Negative.   Skin: Negative.   Neurological: Negative.   Endo/Heme/Allergies: Negative.   Psychiatric/Behavioral:       Delusional    Blood pressure 152/82, pulse 68, temperature 98.6 F (37 C), temperature source Oral, resp. rate 18, SpO2 98 %.There is no weight on file to calculate BMI.  General Appearance: Disheveled  Eye Sport and exercise psychologist::  Fair  Speech:  Normal Rate  Volume:  Normal  Mood:  Anxious  Affect:  Flat  Thought Process:  Disorganized  Orientation:  Other:  person  Thought Content:  Delusions and Paranoid Ideation  Suicidal Thoughts:  No  Homicidal Thoughts:  No  Memory:  Immediate;   Fair Recent;   Poor Remote;   Poor  Judgement:  Impaired  Insight:  Lacking  Psychomotor Activity:  Normal  Concentration:  Poor  Recall:  Poor  Fund of Knowledge:Fair  Language: Fair  Akathisia:  No   Handed:  Right  AIMS (if indicated):     Assets:  Housing Leisure Time Physical Health Resilience Social Support  ADL's:  Intact  Cognition: WNL  Sleep:      Treatment Plan Summary: Daily contact with patient to assess and evaluate symptoms and progress in treatment, Medication management and Plan schizophrenia, paranoia:  -Crisis stabilization -medication management:  Invega increased from 3 mg daily to 6 mg daily for psychosis, Cogentin 2 mg BID reduced to 1 mg BID to prevent EPS.  Trazodone 100 mg at bedtime for sleep, Klonopin 0.5 mg BID PRN severe anxiety, and Vistaril 25 mg TID PRN anxiety -Individual counseling  Disposition: Recommend psychiatric Inpatient admission when medically cleared.  Waylan Boga, [place] 06/14/2015 4:39 PM Patient seen face-to-face for psychiatric evaluation, chart  reviewed and case discussed with the physician extender and developed treatment plan. Reviewed the information documented and agree with the treatment plan. Corena Pilgrim, MD

## 2015-06-14 NOTE — ED Notes (Signed)
Patient's belongings placed in locker 32, and 29.

## 2015-06-14 NOTE — BH Assessment (Signed)
[facility] Assessment Progress Note  [facility]  Decision pending.  Doylene Canninghomas Kyrese Gartman, MA Triage Specialist [phone]

## 2015-06-15 DIAGNOSIS — F200 Paranoid schizophrenia: Secondary | ICD-10-CM | POA: Diagnosis not present

## 2015-06-15 NOTE — ED Notes (Signed)
Sitter states that pt is on computer on room looking up nuclear research.  Made Charge RN, NarkaJakeema aware as well as security.  Charge RN made pt aware that he isn't allowed to get on computer in his room.

## 2015-06-15 NOTE — ED Notes (Signed)
TTS MD at bedside. 

## 2015-06-15 NOTE — ED Notes (Signed)
Pt making his 2nd phone call.

## 2015-06-15 NOTE — ED Notes (Signed)
Pt took his bottle of lotion and squirt on graham crackers and ate it.  RN was able to get lotion away while pt was in shower.
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
## 2015-06-15 NOTE — BH Assessment (Signed)
BHH Assessment Progress Note  The following facilities have been contacted to seek placement for this pt, with results as noted:  Beds available, information sent, decision pending:  [facility]   Declined:  [facility] (does not qualify for their geriatric program) [facility] (due to chronicity) [facility] (due to chronicity) [facility] (too young for their geriatric program) [facility] (exhausted Medicare psychiatric hospitalization days)    At capacity:  [facility]   Ammarie Matsuura, [place] Triage Specialist [phone]

## 2015-06-15 NOTE — ED Notes (Signed)
Patient is resting comfortably. 

## 2015-06-15 NOTE — ED Notes (Signed)
Pt awake, alert & responsive, no distress noted, easily arouseable to verbal stimuli.  Monitoring for safety, Q 15 min checks in effect.

## 2015-06-15 NOTE — BH Assessment (Signed)
Re-assessment: Presented to [facility] 06/13/2015 after he called 911 complaining that his lithium levels were not adjusted correctly. 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 Pt has a history of schizophrenia and delusional disorder.   Writer met with patient today who denied SI, HI, and AVH's. Patient is apparently delusional stating, "I have to get up and go to work". Sts he works for the city at [employer]". Per Pt's chart he has a history of delusions that he has a PhD from several institutions, works at [employer], works as a Tax adviser, and is in Animator with the [employer]. Eye contact is at times direct, at others absent. Pt's mood is anxious and irritable. His affect is labile. Thought process is tangential with delusional content. Pt appears to be preoccupied and disorganized.   Per Dr. Darleene Cleaver and Reginold Agent, NP patient to remain in the ED for [place] placement. TTS to seek placement.

## 2015-06-15 NOTE — ED Notes (Addendum)
Pt came out to nurses' desk asking to use phone to call his mother.  Pt allowed to use phone.  After pt got off the phone he demanded to get his belongings and to sign out "AMA".  Informed pt that he can't sign himself out AMA due to him being under IVC papers.  Pt wanted to see a copy for himself and asking who signed the papers.  Informed him that it was one of the ED providers that signed the papers.  Pt was made a copy of IVC papers to keep in his room.

## 2015-06-15 NOTE — ED Notes (Signed)
Toyka at bedside

## 2015-06-15 NOTE — ED Notes (Signed)
Pt arrived to room 37.  Pt  Asked me if I needed a job and closed his eyes and went to sleep.  15 minute checks and video monitoring in place.

## 2015-06-15 NOTE — ED Notes (Signed)
Patient refused his glucose check tonight RN Joanie CoddingtonLatricia was made aware

## 2015-06-15 NOTE — BHH Counselor (Signed)
06/15/15 Referral sent to [facility], [facility], [facility], [facility], and [facility]  Raelan Burgoon K. Jesus GeneraHarris, LPC-A, [facility]  Counselor 06/15/2015 5:33 AM

## 2015-06-16 ENCOUNTER — Inpatient Hospital Stay
Admission: EM | Admit: 2015-06-16 | Discharge: 2015-06-22 | DRG: 885 | Disposition: A | Discharge status: Nursing Home (Includes ICF) | Payer: Medicare Other | Source: Intra-hospital | Attending: Psychiatry | Admitting: Psychiatry

## 2015-06-16 DIAGNOSIS — F200 Paranoid schizophrenia: Secondary | ICD-10-CM | POA: Diagnosis present

## 2015-06-16 DIAGNOSIS — E119 Type 2 diabetes mellitus without complications: Secondary | ICD-10-CM | POA: Diagnosis present

## 2015-06-16 DIAGNOSIS — F17210 Nicotine dependence, cigarettes, uncomplicated: Secondary | ICD-10-CM | POA: Diagnosis present

## 2015-06-16 DIAGNOSIS — G4700 Insomnia, unspecified: Secondary | ICD-10-CM | POA: Diagnosis present

## 2015-06-16 DIAGNOSIS — M795 Residual foreign body in soft tissue: Secondary | ICD-10-CM

## 2015-06-16 DIAGNOSIS — Z79899 Other long term (current) drug therapy: Secondary | ICD-10-CM

## 2015-06-16 DIAGNOSIS — T189XXA Foreign body of alimentary tract, part unspecified, initial encounter: Secondary | ICD-10-CM

## 2015-06-16 DIAGNOSIS — Z9119 Patient's noncompliance with other medical treatment and regimen: Secondary | ICD-10-CM

## 2015-06-16 DIAGNOSIS — F250 Schizoaffective disorder, bipolar type: Secondary | ICD-10-CM | POA: Diagnosis present

## 2015-06-16 DIAGNOSIS — Z888 Allergy status to other drugs, medicaments and biological substances status: Secondary | ICD-10-CM

## 2015-06-16 MED ORDER — MAGNESIUM HYDROXIDE 400 MG/5ML PO SUSP
30.0000 mL | Freq: Every day | ORAL | Status: DC | PRN
Start: 2015-06-16 — End: 2015-06-22

## 2015-06-16 MED ORDER — ACETAMINOPHEN 325 MG PO TABS
650.0000 mg | ORAL_TABLET | Freq: Four times a day (QID) | ORAL | Status: DC | PRN
  Administered 2015-06-17 – 2015-06-22 (×3): 650 mg via ORAL
  Filled 2015-06-16 (×5): qty 2

## 2015-06-16 MED ORDER — LOPERAMIDE HCL 2 MG PO CAPS
2.0000 mg | ORAL_CAPSULE | ORAL | Status: DC | PRN
  Administered 2015-06-16: 2 mg via ORAL
  Filled 2015-06-16: qty 1

## 2015-06-16 MED ORDER — ALUM & MAG HYDROXIDE-SIMETH 200-200-20 MG/5ML PO SUSP: 30.0000 mL | ORAL | Status: DC | PRN

## 2015-06-16 MED ORDER — LORAZEPAM 2 MG PO TABS: 2.0000 mg | ORAL_TABLET | ORAL | Status: DC | PRN

## 2015-06-16 MED ORDER — TRAZODONE HCL 100 MG PO TABS: 100.0000 mg | ORAL_TABLET | Freq: Once | ORAL | Status: DC

## 2015-06-16 MED ORDER — TRAZODONE HCL 100 MG PO TABS
100.0000 mg | ORAL_TABLET | Freq: Every day | ORAL | Status: DC
  Administered 2015-06-19 – 2015-06-22 (×3): 100 mg via ORAL
  Filled 2015-06-16 (×5): qty 1

## 2015-06-16 NOTE — Consult Note (Signed)
Psychiatric Specialty Exam: Physical Exam  ROS  Blood pressure 134/66, pulse 89, temperature 97.9 F (36.6 C), temperature source Oral, resp. rate 18, SpO2 95 %.There is no weight on file to calculate BMI.  General Appearance: Casual  Eye Contact::  Fair  Speech:  Clear and Coherent and Slow  Volume:  Normal  Mood:  Euthymic  Affect:  Congruent  Thought Process:  Irrelevant, Loose and Tangential  Orientation:  Full (Time, Place, and Person)  Thought Content:  Delusions  Suicidal Thoughts:  No  Homicidal Thoughts:  No  Memory:  Immediate;   Fair Recent;   Fair Remote;   Fair  Judgement:  Fair  Insight:  Fair  Psychomotor Activity:  Normal  Concentration:  Fair  Recall:  FiservFair  Fund of Knowledge:  Fair  Language:  Fair  Akathisia:  No  Handed:  Right  AIMS (if indicated):     Assets:  Desire for Improvement  ADL's:  Intact  Cognition:  Impaired,  Mild  Sleep:      Patient was seen in his room today.  He is alert and oriented  X 3.  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  Patient answered most of the questions correctly.  He gets intermittently disorganized and inappropriately answers questions incorrectly.  Patient denies SI/HI/AVH.    Schizophrenia, paranoid (HCC)   Plan:  Continue to pursue inpatient placement while we offer his medications.  Dahlia ByesJosephine Onuoha   PMHNP-BC Patient seen face-to-face for psychiatric evaluation, chart reviewed and case discussed with the physician extender and developed treatment plan. Reviewed the information documented and agree with the treatment plan. Thedore MinsMojeed Amarie Tarte, MD
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
## 2015-06-16 NOTE — ED Notes (Signed)
Pt Awake, alert & responsive, no distress noted, resting at present.  Pending Sheriffs transportation [facility].  Monitoring for safety, Q 15 min checks in effect.

## 2015-06-16 NOTE — BH Assessment (Signed)
[facility] been [facility].  Accepting physician is Dr. Lucianne MussKumar on the behalf of Dr. Jennet MaduroPucilowska  Attending Physician will be Dr. Jennet MaduroPucilowska.  Patient has been assigned to room 309, by [facility] Charge Nurse Gwyn Call report to [phone].  Representative/Transfer Coordinator is Javonte Elenes.  [facility] ER Staff Maisie Fus(Thomas, TTS) made aware of acceptance.

## 2015-06-17 ENCOUNTER — Encounter: Payer: Self-pay | Admitting: Psychiatry

## 2015-06-17 DIAGNOSIS — F200 Paranoid schizophrenia: Secondary | ICD-10-CM

## 2015-06-17 LAB — URINALYSIS COMPLETE WITH MICROSCOPIC (ARMC ONLY)
Bacteria, UA: NONE SEEN
Bilirubin Urine: NEGATIVE
Glucose, UA: NEGATIVE mg/dL
Hgb urine dipstick: NEGATIVE
KETONES UR: NEGATIVE mg/dL
Leukocytes, UA: NEGATIVE
Nitrite: NEGATIVE
PROTEIN: NEGATIVE mg/dL
SQUAMOUS EPITHELIAL / LPF: NONE SEEN
Specific Gravity, Urine: 1.006 (ref 1.005–1.030)
WBC, UA: NONE SEEN WBC/hpf (ref 0–5)
pH: 6 (ref 5.0–8.0)

## 2015-06-17 LAB — URINE DRUG SCREEN, QUALITATIVE (ARMC ONLY)
AMPHETAMINES, UR SCREEN: NOT DETECTED
Barbiturates, Ur Screen: NOT DETECTED
Benzodiazepine, Ur Scrn: NOT DETECTED
Cannabinoid 50 Ng, Ur ~~LOC~~: NOT DETECTED
Cocaine Metabolite,Ur ~~LOC~~: NOT DETECTED
MDMA (ECSTASY) UR SCREEN: NOT DETECTED
Methadone Scn, Ur: NOT DETECTED
Opiate, Ur Screen: NOT DETECTED
PHENCYCLIDINE (PCP) UR S: NOT DETECTED
Tricyclic, Ur Screen: NOT DETECTED

## 2015-06-17 MED ORDER — PALIPERIDONE PALMITATE 234 MG/1.5ML IM SUSP
234.0000 mg | INTRAMUSCULAR | Status: DC
  Filled 2015-06-17: qty 1.5

## 2015-06-17 MED ORDER — OXCARBAZEPINE 300 MG PO TABS
300.0000 mg | ORAL_TABLET | Freq: Two times a day (BID) | ORAL | Status: DC
  Administered 2015-06-17 – 2015-06-22 (×4): 300 mg via ORAL
  Filled 2015-06-17 (×13): qty 1

## 2015-06-17 MED ORDER — LITHIUM CARBONATE 300 MG PO CAPS
300.0000 mg | ORAL_CAPSULE | Freq: Three times a day (TID) | ORAL | Status: DC
  Administered 2015-06-17 – 2015-06-22 (×15): 300 mg via ORAL
  Filled 2015-06-17 (×14): qty 1

## 2015-06-17 MED ORDER — BENZTROPINE MESYLATE 1 MG PO TABS
1.0000 mg | ORAL_TABLET | Freq: Once | ORAL | Status: DC
  Filled 2015-06-17 (×2): qty 1

## 2015-06-17 MED ORDER — PALIPERIDONE PALMITATE 156 MG/ML IM SUSP: 156.0000 mg | Freq: Once | INTRAMUSCULAR | Status: DC

## 2015-06-17 MED ORDER — PALIPERIDONE PALMITATE 234 MG/1.5ML IM SUSP: 234.0000 mg | INTRAMUSCULAR | Status: DC

## 2015-06-17 NOTE — Tx Team (Addendum)
Initial Interdisciplinary Treatment Plan   PATIENT STRESSORS: Financial difficulties Loss of housing   PATIENT STRENGTHS: General fund of knowledge Motivation for treatment/growth Supportive family/friends   PROBLEM LIST: Problem List/Patient Goals Date to be addressed Date deferred Reason deferred Estimated date of resolution  Schizophrenia  06/16/15           Anxiety  06/16/15           Insomnia  06/16/15           "check lithium levels" 06/16/15                  DISCHARGE CRITERIA:  Adequate post-discharge living arrangements Improved stabilization in mood, thinking, and/or behavior Medical problems require only outpatient monitoring Motivation to continue treatment in a less acute level of care  PRELIMINARY DISCHARGE PLAN: Outpatient therapy Placement in alternative living arrangements  PATIENT/FAMIILY INVOLVEMENT: This treatment plan has been presented to and reviewed with the patient, Julian [name],The patient and family have been given the opportunity to ask questions and make suggestions.  Olubukola Abisola Ajetunmobi 06/17/2015, 2:20 AM

## 2015-06-17 NOTE — BHH Group Notes (Signed)
BHH Group Notes:  (Nursing/MHT/Case Management/Adjunct)  Date:  06/17/2015  Time:  2:14 PM  Type of Therapy:  Group Therapy  Participation Level:  Did Not Attend  Ja Pistole De'Chelle Kaymarie Wynn 06/17/2015, 2:14 PM

## 2015-06-17 NOTE — Progress Notes (Signed)
Recreation Therapy Notes  LRT did not hold group as there were not appropriate group rooms available.  Karder Goodin M, LRT/CTRS 06/17/2015 4:46 PM 

## 2015-06-17 NOTE — Progress Notes (Signed)
Recreation Therapy Notes  LRT spoke with patient's social worker regarding patient's assessment. Patient's social worker reported patient is not appropriate for assessment at this time.  Julian CreeGreene,Julian [name] M, LRT/CTRS 06/17/2015 3:20 PM

## 2015-06-17 NOTE — Plan of Care (Signed)
Problem: Alteration in thought process Goal: LTG-Patient behavior demonstrates decreased signs psychosis (Patient behavior demonstrates decreased signs of psychosis to the point the patient is safe to return home and continue treatment in an outpatient setting.)  Outcome: Not Progressing Patient  Continue altered thought process, demanding
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
## 2015-06-17 NOTE — Progress Notes (Signed)
D: Patient stated slept good last night .Stated appetite is good and energy level  Is high Pacing hall questioning why he is here. Patient had a paper clip  Stated he left it in a book which he gave to MD . Patient search with no paper clip found. Thought process altered  .Denies suicidal  homicidal ideations  .  No auditory hallucinations  No pain concerns . Appropriate ADL'S. Interacting with peers and staff.  A: Encourage patient participation with unit programming . Instruction  Given on  Medication , verbalize understanding. R: Voice no other concerns. Staff continue to monitor

## 2015-06-17 NOTE — [facility] Suicide Risk Assessment (Signed)
[facility] Admission Suicide Risk Assessment   Nursing information obtained from:    Demographic factors:    Current Mental Status:    Loss Factors:    Historical Factors:    Risk Reduction Factors:    Total Time spent with patient: 1 hour Principal Problem: Schizophrenia, paranoid (HCC) Diagnosis:   Patient Active Problem List   Diagnosis Date Noted  . Schizophrenia, paranoid (HCC) [F20.0] 06/14/2015     Continued Clinical Symptoms:  Alcohol Use Disorder Identification Test Final Score (AUDIT): 0 The "Alcohol Use Disorders Identification Test", Guidelines for Use in Primary Care, Second Edition.  World Science writer [facility]). Score between 0-7:  no or low risk or alcohol related problems. Score between 8-15:  moderate risk of alcohol related problems. Score between 16-19:  high risk of alcohol related problems. Score 20 or above:  warrants further diagnostic evaluation for alcohol dependence and treatment.   CLINICAL FACTORS:   Schizophrenia:   Paranoid or undifferentiated type   Musculoskeletal: Strength & Muscle Tone: within normal limits Gait & Station: normal Patient leans: N/A  Psychiatric Specialty Exam: Physical Exam  Nursing note and vitals reviewed. Constitutional: He is oriented to person, place, and time. He appears well-developed and well-nourished.  HENT:  Head: Normocephalic and atraumatic.  Eyes: Conjunctivae and EOM are normal. Pupils are equal, round, and reactive to light.  Neck: Normal range of motion. Neck supple.  Cardiovascular: Normal rate, regular rhythm and normal heart sounds.   Respiratory: Effort normal and breath sounds normal.  GI: Soft. Bowel sounds are normal.  Musculoskeletal: Normal range of motion.  Neurological: He is alert and oriented to person, place, and time.  Skin: Skin is warm and dry.    Review of Systems  All other systems reviewed and are negative.   Blood pressure 132/85, pulse 71, temperature 98.4 F (36.9 C),  temperature source Oral, resp. rate 20, height 6' (1.829 m), weight 85.276 kg (188 lb), SpO2 95 %.Body mass index is 25.49 kg/(m^2).  General Appearance: Disheveled  Eye Contact::  Good  Speech:  Pressured  Volume:  Increased  Mood:  Dysphoric  Affect:  Labile  Thought Process:  Disorganized  Orientation:  Full (Time, Place, and Person)  Thought Content:  Delusions and Paranoid Ideation  Suicidal Thoughts:  No  Homicidal Thoughts:  No  Memory:  Immediate;   Fair Recent;   Fair Remote;   Fair  Judgement:  Impaired  Insight:  Lacking  Psychomotor Activity:  Increased  Concentration:  Fair  Recall:  [employer] of Knowledge:Fair  Language: Fair  Akathisia:  No  Handed:  Right  AIMS (if indicated):     Assets:  Communication Skills Desire for Improvement Financial Resources/Insurance Physical Health Resilience Social Support  Sleep:  Number of Hours: 5.15  Cognition: WNL  ADL's:  Intact     COGNITIVE FEATURES THAT CONTRIBUTE TO RISK:  None    SUICIDE RISK:   Minimal: No identifiable suicidal ideation.  Patients presenting with no risk factors but with morbid ruminations; may be classified as minimal risk based on the severity of the depressive symptoms  PLAN OF CARE: hospital admission, medication management, discharge planning.  Medical Decision Making:  New problem, with additional work up planned, Review of Psycho-Social Stressors (1), Review or order clinical lab tests (1), Review of Medication Regimen & Side Effects (2) and Review of New Medication or Change in Dosage (2)   Mr. Pfiester is a 59 year old male with history of paranoid schizophrenia admitted floridly  psychotic in the context of treatment noncompliance.  1. Psychosis. The patient was maintained for years on a combination of Risperdal, Lithium and Trileptal. I noticed that he received an injection of Invega sustenna as late as June 2016. We will restart his medications.  2. Insomnia. He was started on  trazodone.  3. Diabetes. The patient has a history of diabetes on and off metformin. We will check hemoglobin A1c and monitor his sugars.  4. Foreign object in his abdomen. Chest and abdomen x-rays from [facility] on September 12 indicate that the patient must have swallowed metal objects possibly a magnet. No recommendations were provided by emergency room physicians. We'll consult surgery.  5. Disposition. The patient will need placement in a group home. He is incompetent adult will contact his guardian. He will follow-up with mental health provider.   I certify that inpatient services furnished can reasonably be expected to improve the patient's condition.   Haizel Gatchell 06/17/2015, 3:33 PM

## 2015-06-17 NOTE — ED Notes (Signed)
Patients belongings [facility] w/ [place] Sheriff.  Nicole in intake aware

## 2015-06-17 NOTE — [facility] Group Notes (Signed)
[facility] LCSW Group Therapy   06/16/2015 1:15 PM   Type of Therapy: Group Therapy   Participation Level: Did Not Attend. Patient invited to participate but declined.    Labrisha Wuellner F. Vlad Mayberry, MSW, LCSWA, LCAS   

## 2015-06-17 NOTE — ED Notes (Signed)
Chart reviewed to locate belongings

## 2015-06-17 NOTE — Progress Notes (Signed)
Admission Note:  [age] yr male who presents IVC in acute distress for the treatment of schizophrenia, delusional disorder and Paranoia.  Patient's affect is blunted, hostile and verbally aggressive towards staff. Patient thought content is disorganized; characterized with delusions and paranoid ideation. 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 Pt has Past medical Hx of Schizophrenia, delusional disorder and  DM without complication . Patient's CBG was 94 upon admission.Skin was assessed and found to be clear of any abnormal marks, patient searched and no contraband found, POC and unit policies explained and understanding verbalized. Consents obtained. Food and fluids offered, and fluids accepted. Pt had no additional questions, safety maintained through 15 minutes checks.

## 2015-06-17 NOTE — Plan of Care (Signed)
Problem: Alteration in thought process Goal: LTG-Patient behavior demonstrates decreased signs psychosis (Patient behavior demonstrates decreased signs of psychosis to the point the patient is safe to return home and continue treatment in an outpatient setting.)  Outcome: Not Progressing Patient is psychotic delusional and believes people are after him.

## 2015-06-17 NOTE — [facility] Group Notes (Signed)
[facility] LCSW Group Therapy   06/17/2015 11:00 am  Type of Therapy: Group Therapy   Participation Level: Did Not Attend. Patient invited to participate but declined.    Montrey Buist F. Cody Albus, MSW, LCSWA, LCAS   

## 2015-06-18 LAB — GLUCOSE, CAPILLARY
GLUCOSE-CAPILLARY: 96 mg/dL (ref 65–99)
Glucose-Capillary: 130 mg/dL — ABNORMAL HIGH (ref 65–99)

## 2015-06-18 LAB — TSH: TSH: 1.41 u[IU]/mL (ref 0.350–4.500)

## 2015-06-18 MED ORDER — BENZTROPINE MESYLATE 1 MG PO TABS
2.0000 mg | ORAL_TABLET | Freq: Two times a day (BID) | ORAL | Status: DC
  Administered 2015-06-18 – 2015-06-21 (×6): 2 mg via ORAL
  Filled 2015-06-18 (×6): qty 2

## 2015-06-18 NOTE — [facility] Group Notes (Signed)
[facility] Group Notes:  (Nursing/MHT/Case Management/Adjunct)  Date:  06/18/2015  Time:  11:43 AM  Type of Therapy:  Group Therapy  Participation Level:  Did Not Attend  Velda Wendt De'Chelle Lakeshia Dohner 06/18/2015, 11:43 AM

## 2015-06-18 NOTE — [facility] Group Notes (Signed)
[facility] LCSW Group Therapy   06/18/2015 1:15 pm  Type of Therapy: Group Therapy   Participation Level: Did Not Attend. Patient invited to participate but declined.    Julian [name] F. Marily Konczal, MSW, LCSWA, LCAS   

## 2015-06-18 NOTE — Plan of Care (Signed)
Problem: Consults Goal: [facility] General Treatment Patient Education Outcome: Progressing No SI/HI at this time.      

## 2015-06-18 NOTE — Progress Notes (Signed)
Recreation Therapy Notes  Date: 12.16.16 Time: 3:15 pm Location: Craft Room  Group Topic: Coping Skills  Goal Area(s) Addresses:  Patient will participate in healthy coping skill. Patient will verbalize benefit of using art as a coping skill.  Behavioral Response: Arrived late  Intervention: Coloring  Activity: Patients were given coloring sheets and instructed to color and focus on what emotions they were feeling and what they were focused on.   Education: LRT educated patients on healthy coping skills.  Education Outcome: Patient arrived at the end of group.  Clinical Observations/Feedback: Patient arrived to group at approximately 3:55 pm when group was ending.  Jacquelynn CreeGreene,Tyesha Joffe M, LRT/CTRS 06/18/2015 4:28 PM

## 2015-06-18 NOTE — Progress Notes (Signed)
Patient refuses xray this morning education provided during one on one with patient and patient refuses xray again and  states " I need to get my radiation level down". MD and xray dept. Notified.

## 2015-06-18 NOTE — Progress Notes (Signed)
With encouragement and support patient agrees to have blood work drawn at this time.

## 2015-06-18 NOTE — [facility] Notes (Signed)
[facility]  06/18/2015 11:51 AM Julian [name]  MRN:  [id number]  Subjective:  Julian [name] remains extremely paranoid and delusional. She wants to discuss nuclear physics with me. 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 Intrusive and unpredictable. He does accept medications but refuses any lab work. We spoke with his act team and have a full list of his medications. He's been maintained on the lithium and does injections but has not been compliant with medication prior to admission. There are no somatic complaints. He participates in programming but is disruptive in group. He has copious notes   Principal Problem: Schizophrenia, paranoid (HCC) Diagnosis:   Patient Active Problem List   Diagnosis Date Noted  . Schizophrenia, paranoid (HCC) [F20.0] 06/14/2015   Total Time spent with patient: 20 minutes  Past Psychiatric History: Schizophrenia.  Past Medical History:  Past Medical History  Diagnosis Date  . Diabetes mellitus without complication (HCC)   . Schizophrenia (HCC)    History reviewed. No pertinent past surgical history. Family History: History reviewed. No pertinent family history. Family Psychiatric  History: None reported. Social History:  History  Alcohol Use No     History  Drug Use No    Social History   Social History  . Marital Status: Married    Spouse Name: N/A  . Number of Children: N/A  . Years of Education: N/A   Social History Main Topics  . Smoking status: Current Every Day Smoker -- 1.00 packs/day    Types: Cigarettes  . Smokeless tobacco: None  . Alcohol Use: No  . Drug Use: No  . Sexual Activity: No   Other Topics Concern  . None   Social History Narrative   Additional Social History:                         Sleep: Fair  Appetite:  Fair  Current Medications: Current Facility-Administered Medications  Medication Dose Route Frequency Provider Last Rate Last Dose  . acetaminophen  (TYLENOL) tablet 650 mg  650 mg Oral Q6H PRN Audery AmelJohn T Clapacs, MD   650 mg at 06/17/15 1746  . alum & mag hydroxide-simeth (MAALOX/MYLANTA) 200-200-20 MG/5ML suspension 30 mL  30 mL Oral Q4H PRN Audery AmelJohn T Clapacs, MD      . benztropine (COGENTIN) tablet 1 mg  1 mg Oral Once Brandy HaleUzma Faheem, MD   1 mg at 06/17/15 2259  . benztropine (COGENTIN) tablet 2 mg  2 mg Oral BID Jolanta B Pucilowska, MD      . lithium carbonate capsule 300 mg  300 mg Oral TID WC Jolanta B Pucilowska, MD   300 mg at 06/18/15 0959  . LORazepam (ATIVAN) tablet 2 mg  2 mg Oral Q2H PRN Audery AmelJohn T Clapacs, MD      . magnesium hydroxide (MILK OF MAGNESIA) suspension 30 mL  30 mL Oral Daily PRN Audery AmelJohn T Clapacs, MD      . Oxcarbazepine (TRILEPTAL) tablet 300 mg  300 mg Oral BID Shari ProwsJolanta B Pucilowska, MD   300 mg at 06/17/15 1712  . [START ON 07/08/2015] paliperidone (INVEGA SUSTENNA) injection 234 mg  234 mg Intramuscular Q28 days Jolanta B Pucilowska, MD      . traZODone (DESYREL) tablet 100 mg  100 mg Oral QHS Audery AmelJohn T Clapacs, MD   100 mg at 06/17/15 0003    Lab Results:  Results for orders placed or performed during the hospital  encounter of 06/16/15 (from the past 48 hour(s))  Urinalysis complete, with microscopic ([facility] only)     Status: Abnormal   Collection Time: 06/17/15  8:00 PM  Result Value Ref Range   Color, Urine STRAW (A) YELLOW   APPearance CLEAR (A) CLEAR   Glucose, UA NEGATIVE NEGATIVE mg/dL   Bilirubin Urine NEGATIVE NEGATIVE   Ketones, ur NEGATIVE NEGATIVE mg/dL   Specific Gravity, Urine 1.006 1.005 - 1.030   Hgb urine dipstick NEGATIVE NEGATIVE   pH 6.0 5.0 - 8.0   Protein, ur NEGATIVE NEGATIVE mg/dL   Nitrite NEGATIVE NEGATIVE   Leukocytes, UA NEGATIVE NEGATIVE   RBC / HPF 0-5 0 - 5 RBC/hpf   WBC, UA NONE SEEN 0 - 5 WBC/hpf   Bacteria, UA NONE SEEN NONE SEEN   Squamous Epithelial / LPF NONE SEEN NONE SEEN  Urine Drug Screen, Qualitative ([facility] only)     Status: None   Collection Time: 06/17/15  8:00 PM  Result Value  Ref Range   Tricyclic, Ur Screen NONE DETECTED NONE DETECTED   Amphetamines, Ur Screen NONE DETECTED NONE DETECTED   MDMA (Ecstasy)Ur Screen NONE DETECTED NONE DETECTED   Cocaine Metabolite,Ur [place] NONE DETECTED NONE DETECTED   Opiate, Ur Screen NONE DETECTED NONE DETECTED   Phencyclidine (PCP) Ur S NONE DETECTED NONE DETECTED   Cannabinoid 50 Ng, Ur [place] NONE DETECTED NONE DETECTED   Barbiturates, Ur Screen NONE DETECTED NONE DETECTED   Benzodiazepine, Ur Scrn NONE DETECTED NONE DETECTED   Methadone Scn, Ur NONE DETECTED NONE DETECTED    Comment: (NOTE) 100  Tricyclics, urine               Cutoff 1000 ng/mL 200  Amphetamines, urine             Cutoff 1000 ng/mL 300  MDMA (Ecstasy), urine           Cutoff 500 ng/mL 400  Cocaine Metabolite, urine       Cutoff 300 ng/mL 500  Opiate, urine                   Cutoff 300 ng/mL 600  Phencyclidine (PCP), urine      Cutoff 25 ng/mL 700  Cannabinoid, urine              Cutoff 50 ng/mL 800  Barbiturates, urine             Cutoff 200 ng/mL 900  Benzodiazepine, urine           Cutoff 200 ng/mL 1000 Methadone, urine                Cutoff 300 ng/mL 1100 1200 The urine drug screen provides only a preliminary, unconfirmed 1300 analytical test result and should not be used for non-medical 1400 purposes. Clinical consideration and professional judgment should 1500 be applied to any positive drug screen result due to possible 1600 interfering substances. A more specific alternate chemical method 1700 must be used in order to obtain a confirmed analytical result.  1800 Gas chromato graphy / mass spectrometry (GC/MS) is the preferred 1900 confirmatory method.   Glucose, capillary     Status: Abnormal   Collection Time: 06/18/15  7:08 AM  Result Value Ref Range   Glucose-Capillary 130 (H) 65 - 99 mg/dL    Physical Findings: AIMS: Facial and Oral Movements Muscles of Facial Expression: None, normal Lips and Perioral Area: None, normal Jaw: None,  normal Tongue: None, normal,Extremity Movements Upper (arms,  wrists, hands, fingers): None, normal Lower (legs, knees, ankles, toes): None, normal, Trunk Movements Neck, shoulders, hips: None, normal, Overall Severity Severity of abnormal movements (highest score from questions above): None, normal Incapacitation due to abnormal movements: None, normal Patient's awareness of abnormal movements (rate only patient's report): No Awareness, Dental Status Current problems with teeth and/or dentures?: Yes Does patient usually wear dentures?: No  CIWA:  CIWA-Ar Total: 3 COWS:  COWS Total Score: 1  Musculoskeletal: Strength & Muscle Tone: within normal limits Gait & Station: normal Patient leans: N/A  Psychiatric Specialty Exam: Review of Systems  All other systems reviewed and are negative.   Blood pressure 161/95, pulse 95, temperature 98.2 F (36.8 C), temperature source Oral, resp. rate 20, height 6' (1.829 m), weight 85.276 kg (188 lb), SpO2 95 %.Body mass index is 25.49 kg/(m^2).  General Appearance: Casual  Eye Contact::  Good  Speech:  Pressured  Volume:  Increased  Mood:  Euphoric  Affect:  Inappropriate and Labile  Thought Process:  Goal Directed  Orientation:  Full (Time, Place, and Person)  Thought Content:  Delusions and Paranoid Ideation  Suicidal Thoughts:  No  Homicidal Thoughts:  No  Memory:  Immediate;   Fair Recent;   Fair Remote;   Fair  Judgement:  Fair  Insight:  Fair  Psychomotor Activity:  Increased  Concentration:  Fair  Recall:  [employer] of Knowledge:Fair  Language: Fair  Akathisia:  No  Handed:  Right  AIMS (if indicated):     Assets:  Communication Skills Desire for Improvement Financial Resources/Insurance Housing Physical Health Resilience Social Support  ADL's:  Intact  Cognition: WNL  Sleep:  Number of Hours: 6.45   Treatment Plan Summary: Daily contact with patient to assess and evaluate symptoms and progress in treatment and  Medication management   Julian [name] is a 59 year old male with history of paranoid schizophrenia admitted floridly psychotic in the context of treatment noncompliance.  1. Psychosis. The patient has been maintained for years on a combination of Invega and Lithium. He received an injection of 234 mg of Invega sustenna on 12/8. We restarted Lithium. The patient refuses labs. We added Cogentin for EPS. We will discontinue Trileptal as the patient has been refusing.   2. Insomnia. He was started on trazodone.  3. Diabetes. The patient has a history of diabetes on and off metformin. We will check hemoglobin A1c and monitor his sugars.  4. Foreign objects in the abdomen. Chest and abdomen x-rays from [facility] September 12 indicate presence of foreign bode, possibly a magnet. No recommendations were provided by emergency room physicians. The patient refuses follow up X-ray. Will try again tomorrow and consult surgery.  5. Disposition. The patient will return to his group home. He will follow up with his ACT team. He is an incompetent adult and [facility] DSS is his guardian.    Jolanta Pucilowska 06/18/2015, 11:51 AM

## 2015-06-18 NOTE — Tx Team (Signed)
Interdisciplinary Treatment Plan Update (Adult)  Date:  06/18/2015 Time Reviewed:  2:14 PM  Progress in Treatment: Attending groups: Yes. Participating in groups:  Yes. Taking medication as prescribed:  Yes. Tolerating medication:  Yes. Family/Significant othe contact made:  Yes, individual(s) contacted:  Group home, ACT team, Guardian  Patient understands diagnosis:  No. and As evidenced by:  Limited insight  Discussing patient identified problems/goals with staff:  Yes. Medical problems stabilized or resolved:  Yes. Denies suicidal/homicidal ideation: Yes. Issues/concerns per patient self-inventory:  Yes. Other:  New problem(s) identified: No, Describe:  NA  Discharge Plan or Barriers: Pt plans to return home and follow up with outpatient.    Reason for Continuation of Hospitalization: Delusions  Hallucinations Medication stabilization  Comments:Julian [name] remains extremely paranoid and delusional. She wants to discuss nuclear physics with me. He believes that he runs a company in [place] with [employer]. Intrusive and unpredictable. He does accept medications but refuses any lab work. We spoke with his act team and have a full list of his medications. He's been maintained on the lithium and does injections but has not been compliant with medication prior to admission. There are no somatic complaints. He participates in programming but is disruptive in group. He has copious notes   Estimated length of stay: 7 days   New goal(s): NA  Review of initial/current patient goals per problem list:   1.  Goal(s): Patient will participate in aftercare plan * Met:  * Target date: at discharge * As evidenced by: Patient will participate within aftercare plan AEB aftercare provider and housing plan at discharge being identified.  2.  Goal (s): Patient will demonstrate decreased symptoms of psychosis. * Met: No  *  Target date: at discharge * As evidenced by: Patient will not  endorse signs of psychosis or be deemed stable for discharge by MD.   Attendees: Patient:  Julian [name] 12/16/20162:14 PM  Family:   12/16/20162:14 PM  Physician:   Dr. Bary Leriche  12/16/20162:14 PM  Nursing:   Anderson Malta, RN  12/16/20162:14 PM  Case Manager:   12/16/20162:14 PM  Counselor:   12/16/20162:14 PM  Other:  Wray Kearns, [place] 12/16/20162:14 PM  Other:  Everitt Amber, [place]  12/16/20162:14 PM  Other:   12/16/20162:14 PM  Other:  12/16/20162:14 PM  Other:  12/16/20162:14 PM  Other:  12/16/20162:14 PM  Other:  12/16/20162:14 PM  Other:  12/16/20162:14 PM  Other:  12/16/20162:14 PM  Other:   12/16/20162:14 PM   Scribe for Treatment Team:   Wray Kearns MSW, [place] , 06/18/2015, 2:14 PM

## 2015-06-18 NOTE — [facility] Group Notes (Signed)
[facility] Group Notes:  (Nursing/MHT/Case Management/Adjunct)  Date:  06/18/2015  Time:  1:43 AM  Type of Therapy:  Psychoeducational Skills  Participation Level:  Minimal  Participation Quality:  Inattentive and Resistant  Affect:  Blunted  Cognitive:  Appropriate  Insight:  Limited  Engagement in Group:  Lacking  Modes of Intervention:  Discussion  Summary of Progress/Problems:  Foy GuadalajaraJasmine R Daleyza Gadomski 06/18/2015, 1:43 AM

## 2015-06-18 NOTE — Plan of Care (Signed)
Problem: Ineffective individual coping Goal: STG: Patient will remain free from self harm Outcome: Progressing Pt remains free from harm  Problem: Alteration in thought process Goal: LTG-Patient behavior demonstrates decreased signs psychosis (Patient behavior demonstrates decreased signs of psychosis to the point the patient is safe to return home and continue treatment in an outpatient setting.)  Outcome: Not Progressing Pt continues to show signs of delusions

## 2015-06-18 NOTE — Progress Notes (Addendum)
Patient with flat affect and cooperative behavior with meals and plan of care. Remains with some paranoia, refusing x ray this am. No SI/HI/AVH at this time. Verbalizes needs well, with frequent questions and interactions initiated by patient. Groups encouraged, safety maintained. Refused Trileptal, took Lithium as ordered.

## 2015-06-18 NOTE — [facility] Group Notes (Signed)
[facility] LCSW Aftercare Discharge Planning Group Note  06/18/2015 9:15 AM  Participation Quality: Did Not Attend. Patient invited to participate but declined.   Keontay Vora F. Abrina Petz, MSW, LCSWA, LCAS   

## 2015-06-18 NOTE — Progress Notes (Signed)
D: Pt thought content continues to be disorganized. Denies pain at this time. Denies SI/HI/AVH at this time. Pt is verbally aggressive towards staff. Pt refuses all evening medications, stating that he is experiencing EPS symptoms and will not take them unless he has Benadryl.  A: MD on call notified. Benadryl is not ordered due to a contraindication with the pt's allergy to Haldol. A one time dose of Cogentin is ordered instead. 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 Y'all are trying to kill me." Emotional support and encouragement provided. q15 minute safety checks maintained. R: Pt continues to refuse evening medications and Cogentin despite RN encouragement. Pt remains free from harm.

## 2015-06-19 LAB — COMPREHENSIVE METABOLIC PANEL
ALT: 23 U/L (ref 17–63)
ANION GAP: 8 (ref 5–15)
AST: 21 U/L (ref 15–41)
Albumin: 4.4 g/dL (ref 3.5–5.0)
Alkaline Phosphatase: 70 U/L (ref 38–126)
BUN: 9 mg/dL (ref 6–20)
CHLORIDE: 101 mmol/L (ref 101–111)
CO2: 33 mmol/L — ABNORMAL HIGH (ref 22–32)
CREATININE: 0.61 mg/dL (ref 0.61–1.24)
Calcium: 10.6 mg/dL — ABNORMAL HIGH (ref 8.9–10.3)
Glucose, Bld: 143 mg/dL — ABNORMAL HIGH (ref 65–99)
POTASSIUM: 4.4 mmol/L (ref 3.5–5.1)
Sodium: 142 mmol/L (ref 135–145)
Total Bilirubin: 0.5 mg/dL (ref 0.3–1.2)
Total Protein: 8 g/dL (ref 6.5–8.1)

## 2015-06-19 LAB — HEMOGLOBIN A1C: HEMOGLOBIN A1C: 7.1 % — AB (ref 4.0–6.0)

## 2015-06-19 LAB — GLUCOSE, CAPILLARY
GLUCOSE-CAPILLARY: 129 mg/dL — AB (ref 65–99)
Glucose-Capillary: 133 mg/dL — ABNORMAL HIGH (ref 65–99)

## 2015-06-19 LAB — CBC
HCT: 41.3 % (ref 40.0–52.0)
Hemoglobin: 13.6 g/dL (ref 13.0–18.0)
MCH: 29.9 pg (ref 26.0–34.0)
MCHC: 32.9 g/dL (ref 32.0–36.0)
MCV: 91 fL (ref 80.0–100.0)
PLATELETS: 384 10*3/uL (ref 150–440)
RBC: 4.54 MIL/uL (ref 4.40–5.90)
RDW: 13.8 % (ref 11.5–14.5)
WBC: 8.1 10*3/uL (ref 3.8–10.6)

## 2015-06-19 LAB — LIPID PANEL
Cholesterol: 221 mg/dL — ABNORMAL HIGH (ref 0–200)
HDL: 48 mg/dL (ref >40)
LDL CALC: 137 mg/dL — AB (ref 0–99)
TRIGLYCERIDES: 181 mg/dL — AB (ref <150)
Total CHOL/HDL Ratio: 4.6 RATIO
VLDL: 36 mg/dL (ref 0–40)

## 2015-06-19 LAB — LITHIUM LEVEL: Lithium Lvl: 0.5 mmol/L — ABNORMAL LOW (ref 0.60–1.20)

## 2015-06-19 NOTE — Progress Notes (Signed)
Patient refuses to complete daily inventory sheet.

## 2015-06-19 NOTE — [facility] Group Notes (Signed)
[facility] LCSW Group Therapy  06/19/2015 3:58 PM  Type of Therapy:  Group Therapy  Participation Level:  Active  Participation Quality:  Intrusive  Affect:  Flat  Cognitive:  Delusional  Insight:  Limited  Engagement in Therapy:  Limited  Modes of Intervention:  Discussion, Education, Socialization and Support  Summary of Progress/Problems: Balance in life: Patients will discuss the concept of balance and how it looks and feels to be unbalanced. Pt will identify areas in their life that is unbalanced and ways to become more balanced.  Julian [name] attended group and stayed most of the time. He was unable to stay on topic. He would interrupt to offer people jobs after he receives his million dollars from [employer].   Sempra EnergyCandace L Daejon [name] MSW, LCSWA  06/19/2015, 3:58 PM

## 2015-06-19 NOTE — Plan of Care (Signed)
Problem: Alteration in thought process Goal: LTG-Patient behavior demonstrates decreased signs psychosis (Patient behavior demonstrates decreased signs of psychosis to the point the patient is safe to return home and continue treatment in an outpatient setting.)  Outcome: Progressing Decreased psychosis noted.      

## 2015-06-19 NOTE — Progress Notes (Signed)
Patient refused Trileptal stating "its not the right molecular weight".

## 2015-06-19 NOTE — Progress Notes (Addendum)
[facility] MD Progress Note  06/19/2015 3:55 PM Julian [name]  MRN:  [id number]  Subjective:  Mr. Krupka seems to calm her today he is still slightly intrusive but in a pleasant way. He demands that I help him apply for aggression passport so he can go and talk to [employer]. He still wants to sue governor [employer]. He accepts medications that refuses blood work. He agreed to it today with much encouragement. He actively participates in programming. He interacts with staff and peers almost appropriately. There are no somatic complaints. He is asking to be discharged back to his group home on Monday.   Principal Problem: Schizophrenia, paranoid ([place]) Diagnosis:   Patient Active Problem List   Diagnosis Date Noted  . Schizophrenia, paranoid ([place]) [F20.0] 06/14/2015   Total Time spent with patient: 20 minutes  Past Psychiatric History: Schizophrenia.  Past Medical History:  Past Medical History  Diagnosis Date  . Diabetes mellitus without complication ([place])   . Schizophrenia ([place])    History reviewed. No pertinent past surgical history. Family History: History reviewed. No pertinent family history. Family Psychiatric  History: None reported.  Social History:  History  Alcohol Use No     History  Drug Use No    Social History   Social History  . Marital Status: Married    Spouse Name: N/A  . Number of Children: N/A  . Years of Education: N/A   Social History Main Topics  . Smoking status: Current Every Day Smoker -- 1.00 packs/day    Types: Cigarettes  . Smokeless tobacco: None  . Alcohol Use: No  . Drug Use: No  . Sexual Activity: No   Other Topics Concern  . None   Social History Narrative   Additional Social History:                         Sleep: Good  Appetite:  Good  Current Medications: Current Facility-Administered Medications  Medication Dose Route Frequency Provider Last Rate Last Dose  . acetaminophen (TYLENOL) tablet 650 mg  650 mg  Oral Q6H PRN Gonzella Lex, MD   650 mg at 06/17/15 1746  . alum & mag hydroxide-simeth (MAALOX/MYLANTA) 200-200-20 MG/5ML suspension 30 mL  30 mL Oral Q4H PRN Gonzella Lex, MD      . benztropine (COGENTIN) tablet 1 mg  1 mg Oral Once Rainey Pines, MD   1 mg at 06/17/15 2259  . benztropine (COGENTIN) tablet 2 mg  2 mg Oral BID Clovis Fredrickson, MD   2 mg at 06/19/15 0909  . lithium carbonate capsule 300 mg  300 mg Oral TID WC Justin Buechner B Chrystian Ressler, MD   300 mg at 06/19/15 1157  . LORazepam (ATIVAN) tablet 2 mg  2 mg Oral Q2H PRN Gonzella Lex, MD      . magnesium hydroxide (MILK OF MAGNESIA) suspension 30 mL  30 mL Oral Daily PRN Gonzella Lex, MD      . Oxcarbazepine (TRILEPTAL) tablet 300 mg  300 mg Oral BID Clovis Fredrickson, MD   300 mg at 06/17/15 1712  . [START ON 07/08/2015] paliperidone (INVEGA SUSTENNA) injection 234 mg  234 mg Intramuscular Q28 days Chilton Sallade B Hiyab Nhem, MD      . traZODone (DESYREL) tablet 100 mg  100 mg Oral QHS Gonzella Lex, MD   100 mg at 06/17/15 0003    Lab Results:  Results for orders placed or performed  during the hospital encounter of 06/16/15 (from the past 48 hour(s))  Urinalysis complete, with microscopic ([facility] only)     Status: Abnormal   Collection Time: 06/17/15  8:00 PM  Result Value Ref Range   Color, Urine STRAW (A) YELLOW   APPearance CLEAR (A) CLEAR   Glucose, UA NEGATIVE NEGATIVE mg/dL   Bilirubin Urine NEGATIVE NEGATIVE   Ketones, ur NEGATIVE NEGATIVE mg/dL   Specific Gravity, Urine 1.006 1.005 - 1.030   Hgb urine dipstick NEGATIVE NEGATIVE   pH 6.0 5.0 - 8.0   Protein, ur NEGATIVE NEGATIVE mg/dL   Nitrite NEGATIVE NEGATIVE   Leukocytes, UA NEGATIVE NEGATIVE   RBC / HPF 0-5 0 - 5 RBC/hpf   WBC, UA NONE SEEN 0 - 5 WBC/hpf   Bacteria, UA NONE SEEN NONE SEEN   Squamous Epithelial / LPF NONE SEEN NONE SEEN  Urine Drug Screen, Qualitative ([facility] only)     Status: None   Collection Time: 06/17/15  8:00 PM  Result Value Ref Range    Tricyclic, Ur Screen NONE DETECTED NONE DETECTED   Amphetamines, Ur Screen NONE DETECTED NONE DETECTED   MDMA (Ecstasy)Ur Screen NONE DETECTED NONE DETECTED   Cocaine Metabolite,Ur [place] NONE DETECTED NONE DETECTED   Opiate, Ur Screen NONE DETECTED NONE DETECTED   Phencyclidine (PCP) Ur S NONE DETECTED NONE DETECTED   Cannabinoid 50 Ng, Ur [place] NONE DETECTED NONE DETECTED   Barbiturates, Ur Screen NONE DETECTED NONE DETECTED   Benzodiazepine, Ur Scrn NONE DETECTED NONE DETECTED   Methadone Scn, Ur NONE DETECTED NONE DETECTED    Comment: (NOTE) 093  Tricyclics, urine               Cutoff 1000 ng/mL 200  Amphetamines, urine             Cutoff 1000 ng/mL 300  MDMA (Ecstasy), urine           Cutoff 500 ng/mL 400  Cocaine Metabolite, urine       Cutoff 300 ng/mL 500  Opiate, urine                   Cutoff 300 ng/mL 600  Phencyclidine (PCP), urine      Cutoff 25 ng/mL 700  Cannabinoid, urine              Cutoff 50 ng/mL 800  Barbiturates, urine             Cutoff 200 ng/mL 900  Benzodiazepine, urine           Cutoff 200 ng/mL 1000 Methadone, urine                Cutoff 300 ng/mL 1100 1200 The urine drug screen provides only a preliminary, unconfirmed 1300 analytical test result and should not be used for non-medical 1400 purposes. Clinical consideration and professional judgment should 1500 be applied to any positive drug screen result due to possible 1600 interfering substances. A more specific alternate chemical method 1700 must be used in order to obtain a confirmed analytical result.  1800 Gas chromato graphy / mass spectrometry (GC/MS) is the preferred 1900 confirmatory method.   Glucose, capillary     Status: Abnormal   Collection Time: 06/18/15  7:08 AM  Result Value Ref Range   Glucose-Capillary 130 (H) 65 - 99 mg/dL  TSH     Status: None   Collection Time: 06/18/15  1:21 PM  Result Value Ref Range   TSH 1.410 0.350 - 4.500 uIU/mL  Glucose, capillary     Status: None    Collection Time: 06/18/15  4:51 PM  Result Value Ref Range   Glucose-Capillary 96 65 - 99 mg/dL  Glucose, capillary     Status: Abnormal   Collection Time: 06/19/15  6:54 AM  Result Value Ref Range   Glucose-Capillary 133 (H) 65 - 99 mg/dL  CBC     Status: None   Collection Time: 06/19/15 11:50 AM  Result Value Ref Range   WBC 8.1 3.8 - 10.6 K/uL   RBC 4.54 4.40 - 5.90 MIL/uL   Hemoglobin 13.6 13.0 - 18.0 g/dL   HCT 41.3 40.0 - 52.0 %   MCV 91.0 80.0 - 100.0 fL   MCH 29.9 26.0 - 34.0 pg   MCHC 32.9 32.0 - 36.0 g/dL   RDW 13.8 11.5 - 14.5 %   Platelets 384 150 - 440 K/uL  Comprehensive metabolic panel     Status: Abnormal   Collection Time: 06/19/15 11:50 AM  Result Value Ref Range   Sodium 142 135 - 145 mmol/L   Potassium 4.4 3.5 - 5.1 mmol/L   Chloride 101 101 - 111 mmol/L   CO2 33 (H) 22 - 32 mmol/L   Glucose, Bld 143 (H) 65 - 99 mg/dL   BUN 9 6 - 20 mg/dL   Creatinine, Ser 0.61 0.61 - 1.24 mg/dL   Calcium 10.6 (H) 8.9 - 10.3 mg/dL   Total Protein 8.0 6.5 - 8.1 g/dL   Albumin 4.4 3.5 - 5.0 g/dL   AST 21 15 - 41 U/L   ALT 23 17 - 63 U/L   Alkaline Phosphatase 70 38 - 126 U/L   Total Bilirubin 0.5 0.3 - 1.2 mg/dL   GFR calc non Af Amer >60 >60 mL/min   GFR calc Af Amer >60 >60 mL/min    Comment: (NOTE) The eGFR has been calculated using the CKD EPI equation. This calculation has not been validated in all clinical situations. eGFR's persistently <60 mL/min signify possible Chronic Kidney Disease.    Anion gap 8 5 - 15  Hemoglobin A1c     Status: Abnormal   Collection Time: 06/19/15 11:50 AM  Result Value Ref Range   Hgb A1c MFr Bld 7.1 (H) 4.0 - 6.0 %  Lipid panel     Status: Abnormal   Collection Time: 06/19/15 11:50 AM  Result Value Ref Range   Cholesterol 221 (H) 0 - 200 mg/dL   Triglycerides 181 (H) <150 mg/dL   HDL 48 >40 mg/dL   Total CHOL/HDL Ratio 4.6 RATIO   VLDL 36 0 - 40 mg/dL   LDL Cholesterol 137 (H) 0 - 99 mg/dL    Comment:        Total  Cholesterol/HDL:CHD Risk Coronary Heart Disease Risk Table                     Men   Women  1/2 Average Risk   3.4   3.3  Average Risk       5.0   4.4  2 X Average Risk   9.6   7.1  3 X Average Risk  23.4   11.0        Use the calculated Patient Ratio above and the CHD Risk Table to determine the patient's CHD Risk.        ATP [name] CLASSIFICATION (LDL):  <100     mg/dL   Optimal  100-129  mg/dL   Near or Above  Optimal  130-159  mg/dL   Borderline  160-189  mg/dL   High  >190     mg/dL   Very High   Lithium level     Status: Abnormal   Collection Time: 06/19/15 11:50 AM  Result Value Ref Range   Lithium Lvl 0.50 (L) 0.60 - 1.20 mmol/L    Physical Findings: AIMS: Facial and Oral Movements Muscles of Facial Expression: None, normal Lips and Perioral Area: None, normal Jaw: None, normal Tongue: None, normal,Extremity Movements Upper (arms, wrists, hands, fingers): None, normal Lower (legs, knees, ankles, toes): None, normal, Trunk Movements Neck, shoulders, hips: None, normal, Overall Severity Severity of abnormal movements (highest score from questions above): None, normal Incapacitation due to abnormal movements: None, normal Patient's awareness of abnormal movements (rate only patient's report): No Awareness, Dental Status Current problems with teeth and/or dentures?: Yes Does patient usually wear dentures?: No  CIWA:  CIWA-Ar Total: 3 COWS:  COWS Total Score: 1  Musculoskeletal: Strength & Muscle Tone: within normal limits Gait & Station: normal Patient leans: N/A  Psychiatric Specialty Exam: Review of Systems  All other systems reviewed and are negative.   Blood pressure 158/79, pulse 74, temperature 98 F (36.7 C), temperature source Oral, resp. rate 20, height 6' (1.829 m), weight 85.276 kg (188 lb), SpO2 95 %.Body mass index is 25.49 kg/(m^2).  General Appearance: Casual  Eye Contact::  Good  Speech:  Pressured  Volume:  Normal  Mood:   Euphoric  Affect:  Congruent  Thought Process:  Disorganized  Orientation:  Full (Time, Place, and Person)  Thought Content:  Delusions and Paranoid Ideation  Suicidal Thoughts:  No  Homicidal Thoughts:  No  Memory:  Immediate;   Fair Recent;   Fair Remote;   Fair  Judgement:  Fair  Insight:  Shallow  Psychomotor Activity:  Normal  Concentration:  Fair  Recall:  [place]  Language: Fair  Akathisia:  No  Handed:  Right  AIMS (if indicated):     Assets:  Communication Skills Desire for Improvement Financial Resources/Insurance Housing Physical Health Resilience Social Support  ADL's:  Intact  Cognition: WNL  Sleep:  Number of Hours: 8   Treatment Plan Summary: Daily contact with patient to assess and evaluate symptoms and progress in treatment and Medication management   Mr. Ghan is a 59 year old male with history of paranoid schizophrenia admitted floridly psychotic in the context of treatment noncompliance.  1. Psychosis. The patient has been maintained for years on a combination of Invega and Lithium. He received an injection of 234 mg of Invega sustenna on 12/8. We restarted Lithium li 0.50.  We added Cogentin for EPS. We will discontinue Trileptal as the patient has been refusing.   2. Insomnia. He was started on trazodone.  3. Diabetes. The patient has a history of diabetes on and off metformin. We will check hemoglobin A1c and monitor his sugars. Hemoglobin A1c 7.1.  4. Foreign objects in the abdomen. Chest and abdomen x-rays from [facility] September 12 indicate presence of foreign bode, possibly a magnet. No recommendations were provided by emergency room physicians. The patient refuses follow up X-ray. Will try again tomorrow and consult surgery.  5. Metabolic syndrome. It is slightly elevated. The patient wants to try diet first. TSH is normal.   6. Disposition. The patient will return to his group home. He will follow up with his ACT  team. He is an incompetent adult and [place] is his  guardian.    Jaime Grizzell  Nyjah Denio 06/19/2015, 3:55 PM

## 2015-06-19 NOTE — Progress Notes (Signed)
Patient with sad affect, cooperative behavior with meals, meds and plan of care. No SI/HI/AVH at this time. Visible on the unit, talkative with staff. Patient remains with some fixed delusions, stating "his feet are absorbing chemical". Request made that he can have his sneakers and sneaker with out laces are provided. Safety maintained.

## 2015-06-19 NOTE — Progress Notes (Signed)
D: Pt denies SI/HI/AVH, affect is labile, mood is less irritable, less intrusive and following unit rules and regulations, In addition patient appears less anxious and he is interacting with peers and staff appropriately.  A: Pt was offered support and encouragement. Pt offered scheduled medications. Pt was encouraged to attend groups. Q 15 minute checks were done for safety.  R:Pt attends groups and interacts well with peers and staff. Pt refused evening medication. Pt has no complaints.Pt receptive to treatment and safety maintained on unit.

## 2015-06-20 LAB — GLUCOSE, CAPILLARY: Glucose-Capillary: 154 mg/dL — ABNORMAL HIGH (ref 65–99)

## 2015-06-20 NOTE — Progress Notes (Signed)
Refuse blood glucose monitoring at this time.

## 2015-06-20 NOTE — [facility] Group Notes (Signed)
[facility] LCSW Group Therapy  06/20/2015 4:00 PM  Type of Therapy:  Group Therapy  Participation Level:  Minimal  Participation Quality:  Intrusive and Monopolizing  Affect:  Irritable  Cognitive:  Delusional  Insight:  Limited  Engagement in Therapy:  Limited  Modes of Intervention:  Discussion, Education, Socialization and Support  Summary of Progress/Problems: Pt will identify unhealthy thoughts and how they impact their emotions and behavior. Pt will be encouraged to discuss these thoughts, emotions and behaviors with the group. Greig Castillandrew was asked to leave group. Another group member was discussing concerns regarding medication. Greig Castillandrew became upset. He yelled "he is trying to be a solider! He needs to see an Health and safety inspectorarmy psychatrist. I am not going to let you ruin him!" It took several attempts to get him to stop yelling. He was asked to leave.   Geraldina Parrott L Kazumi Lachney MSW, LCSWA  06/20/2015, 4:00 PM

## 2015-06-20 NOTE — Progress Notes (Signed)
Goal today is to work on Sales executiveBuilding labortories [place].

## 2015-06-20 NOTE — [facility] Counselor (Signed)
Adult Comprehensive Assessment  Patient ID: Julian [name], male   DOB: [birth year], 59 y.o.   MRN: [id number]  Information Source:  Patient   Current Stressors:  Educational / Learning stressors: Pt reports earning a PHD.  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  Family Relationships: None reported  Financial / Lack of resources (include bankruptcy): None reported  Housing / Lack of housing: Pt lives in a group home in [place]  Physical health (include injuries & life threatening diseases): None reported  Social relationships: None reported  Substance abuse: denies use  Bereavement / Loss: None reported   Living/Environment/Situation:  Living Arrangements: Group Home Julian [name] [phone] or [phone]) Living conditions (as described by patient or guardian): Good  How long has patient lived in current situation?: few months  What is atmosphere in current home: Comfortable  Family History:  Marital status: Single Are you sexually active?: No What is your sexual orientation?: Heterosexual  Has your sexual activity been affected by drugs, alcohol, medication, or emotional stress?: None reported  Does patient have children?: No  Childhood History:  By whom was/is the patient raised?: Both parents Description of patient's relationship with caregiver when they were a child: Good relationship with mother, reports father worked a lot.  Patient's description of current relationship with people who raised him/her: Good relationship with mother.  How were you disciplined when you got in trouble as a child/adolescent?: None reported  Does patient have siblings?: Yes Number of Siblings: 4 Description of patient's current relationship with siblings: 3 brothers, 1 sister. distant relationship.  Did patient suffer any verbal/emotional/physical/sexual abuse as a child?: No Has patient ever been sexually abused/assaulted/raped as an adolescent or adult?: No Was the  patient ever a victim of a crime or a disaster?: No Witnessed domestic violence?: No Has patient been effected by domestic violence as an adult?: No  Education:  Highest grade of school patient has completed: Pt reports earning a PHD.  Currently a student?: No Learning disability?: No  Employment/Work Situation:   Employment situation: On disability Why is patient on disability: mental health  How long has patient been on disability: many years  Patient's job has been impacted by current illness: No What is the longest time patient has a held a job?: NA Where was the patient employed at that time?: NA Has patient ever been in the [employer]?: Yes (Describe in comment) (Pt reports being in the [employer] but appears to be a delusion) Has patient ever served in combat?: No Did You Receive Any Psychiatric Treatment/Services While in the [employer]?: No Are There Guns or Other Weapons in Your Home?: No  Financial Resources:   Financial resources: Insurance claims handler, Medicare Does patient have a Lawyer or guardian?: Yes Name of representative payee or guardian: Julian [name] [phone]  Alcohol/Substance Abuse:   What has been your use of drugs/alcohol within the last 12 months?: denies use Alcohol/Substance Abuse Treatment Hx: Denies past history Has alcohol/substance abuse ever caused legal problems?: No  Social Support System:   Patient's Community Support System: Good Describe Community Support System: ACTT, group home, mother  Type of faith/religion: Jehavoh Witness  How does patient's faith help to cope with current illness?: unable to state   Leisure/Recreation:   Leisure and Hobbies: Geneticist, molecular, submariens, model rockets and ships.   Strengths/Needs:   What things does the patient do well?: designing aircrafts, submariens, model rockets and ships.  In what areas does patient struggle / problems for patient:  Unable to state   Discharge Plan:   Does patient  have access to transportation?: Yes Will patient be returning to same living situation after discharge?: Yes Currently receiving community mental health services: Yes (From Whom) ([facility] in [place]) Does patient have financial barriers related to discharge medications?: No  Summary/Recommendations:   Julian [name] is a 59 year old male who present to [facility] with psychosis. He expressed delusions involving the government and [place]. Information provided is questionable due to pt's delusions. He lives in a group home in [place] [phone] or [phone]. His guardian is Julian LeiterMarty [name] [phone]. He left his group home and went to his mother's. He refused to return to the group home or go to the hospital. He told his mother he wanted to stay with a friend in [place] and would return the next day. She bought him a Nurse, mental healthtrain ticket to [place]. He went directly to the hospital when he arrived to [place]. Pt receives [facility] from [facility] ([facility] nurse [phone]). Pt plans to return home and follow up with outpatient.  Recommendations include; crisis stabilization, medication management, therapeutic milieu, and encourage group attendance and participation.   Julian [name] MSW, LCSWA . 06/20/2015

## 2015-06-20 NOTE — Progress Notes (Signed)
Patient with appropriate affect, cooperative behavior with meals and plan of care. Good adls. Minimal interaction with peers. Frequently initiates interaction with staff. Refuses Trileptal this morning. States " too many side effect". Remains delusional Chiropractorhanding writer papers to file in his chart. States he is "going to build a Scientist, product/process developmentsolar system traveling Tour managerrocket ship" and also "some plants here in [place]". Calm and pleasant with no distress. No SI/HI/AVH at this time. Safety maintained.

## 2015-06-20 NOTE — Progress Notes (Signed)
D: Observed pt in day room interacting with peers and staff. Patient alert and oriented x3. Patient denies SI/HI/AVH. Pt affect is flat. Pt has delusion of grandeur stating "I purchased a group home from [employer]." Pt thought process is disorganized, tangential with loose associations. Pt speech is slow and hand tremors where observed. Pt claims "I'm here because I lost my ticket." Pt refused to take Trileptal. A: Offered active listening and support. Provided therapeutic communication. Administered scheduled medications except for Trileptal. Reinforced the importance of following medication regimen.  R: Pt redirectable. Pt continues to refuse Trileptal, but pt cooperative in all other regards. Pt Will continue Q15 min. checks. Safety maintained.

## 2015-06-20 NOTE — Progress Notes (Signed)
[facility] MD Progress Note  06/20/2015 2:19 PM Julian [name] [name]  MRN:  [id number]  Subjective:  Julian [name] has a history of schizoaffective disorder and was admitted in a manic episode. He left his group home in [place] area to tablet first visit his mother in [place] then to [place] to visit a friend. He stopped his medication and was admitted to [facility]. He accepted his lithium back. He is currently on [place] injections. He has much improved. Even though he is still very delusional thinking of going to [place] for 3 years to work with Conservation officer, historic buildings and to [employer], he is not as intrusive and manic. He was my patient several months ago and I believe that he is close to his baseline. He wants to be discharged at the beginning of the week. He will need transportation either from his guardian, ACT team, group home. He also awaits a letter that was sent by his mother from [place] at [place] and $10 bill. He expects [place] to send his many to him or back to his mother. He has a master's degree in physics. He got sick while in graduate school.  Julian [name] has much improved. He is only slightly intrusive. He is able to hold a conversation. He wants to return to his group home as soon as possible. He spoke with his mother on the phone. He accepts medication and tolerates them well. There are no somatic complaints. He is very active and social on the unit. Excellent group participation.  Principal Problem: Schizophrenia, paranoid ([place]) Diagnosis:   Patient Active Problem List   Diagnosis Date Noted  . Schizophrenia, paranoid ([place]) [F20.0] 06/14/2015   Total Time spent with patient: 20 minutes  Past Psychiatric History: Schizophrenia.  Past Medical History:  Past Medical History  Diagnosis Date  . Diabetes mellitus without complication ([place])   . Schizophrenia ([place])    History reviewed. No pertinent past surgical history. Family History: History  reviewed. No pertinent family history. Family Psychiatric  History: None reported. Social History:  History  Alcohol Use No     History  Drug Use No    Social History   Social History  . Marital Status: Married    Spouse Name: N/A  . Number of Children: N/A  . Years of Education: N/A   Social History Main Topics  . Smoking status: Current Every Day Smoker -- 1.00 packs/day    Types: Cigarettes  . Smokeless tobacco: None  . Alcohol Use: No  . Drug Use: No  . Sexual Activity: No   Other Topics Concern  . None   Social History Narrative   Additional Social History:                         Sleep: Good  Appetite:  Good  Current Medications: Current Facility-Administered Medications  Medication Dose Route Frequency Provider Last Rate Last Dose  . acetaminophen (TYLENOL) tablet 650 mg  650 mg Oral Q6H PRN Gonzella Lex, MD   650 mg at 06/17/15 1746  . alum & mag hydroxide-simeth (MAALOX/MYLANTA) 200-200-20 MG/5ML suspension 30 mL  30 mL Oral Q4H PRN Gonzella Lex, MD      . benztropine (COGENTIN) tablet 1 mg  1 mg Oral Once Rainey Pines, MD   1 mg at 06/17/15 2259  . benztropine (COGENTIN) tablet 2 mg  2 mg Oral BID Clovis Fredrickson, MD   2 mg  at 06/20/15 0914  . lithium carbonate capsule 300 mg  300 mg Oral TID WC Veasna Santibanez B Melton Walls, MD   300 mg at 06/20/15 1158  . LORazepam (ATIVAN) tablet 2 mg  2 mg Oral Q2H PRN Gonzella Lex, MD      . magnesium hydroxide (MILK OF MAGNESIA) suspension 30 mL  30 mL Oral Daily PRN Gonzella Lex, MD      . Oxcarbazepine (TRILEPTAL) tablet 300 mg  300 mg Oral BID Clovis Fredrickson, MD   300 mg at 06/17/15 1712  . [START ON 07/08/2015] paliperidone (INVEGA SUSTENNA) injection 234 mg  234 mg Intramuscular Q28 days Clovis Fredrickson, MD      . traZODone (DESYREL) tablet 100 mg  100 mg Oral QHS Gonzella Lex, MD   100 mg at 06/19/15 2246    Lab Results:  Results for orders placed or performed during the hospital  encounter of 06/16/15 (from the past 48 hour(s))  Glucose, capillary     Status: None   Collection Time: 06/18/15  4:51 PM  Result Value Ref Range   Glucose-Capillary 96 65 - 99 mg/dL  Glucose, capillary     Status: Abnormal   Collection Time: 06/19/15  6:54 AM  Result Value Ref Range   Glucose-Capillary 133 (H) 65 - 99 mg/dL  CBC     Status: None   Collection Time: 06/19/15 11:50 AM  Result Value Ref Range   WBC 8.1 3.8 - 10.6 K/uL   RBC 4.54 4.40 - 5.90 MIL/uL   Hemoglobin 13.6 13.0 - 18.0 g/dL   HCT 41.3 40.0 - 52.0 %   MCV 91.0 80.0 - 100.0 fL   MCH 29.9 26.0 - 34.0 pg   MCHC 32.9 32.0 - 36.0 g/dL   RDW 13.8 11.5 - 14.5 %   Platelets 384 150 - 440 K/uL  Comprehensive metabolic panel     Status: Abnormal   Collection Time: 06/19/15 11:50 AM  Result Value Ref Range   Sodium 142 135 - 145 mmol/L   Potassium 4.4 3.5 - 5.1 mmol/L   Chloride 101 101 - 111 mmol/L   CO2 33 (H) 22 - 32 mmol/L   Glucose, Bld 143 (H) 65 - 99 mg/dL   BUN 9 6 - 20 mg/dL   Creatinine, Ser 0.61 0.61 - 1.24 mg/dL   Calcium 10.6 (H) 8.9 - 10.3 mg/dL   Total Protein 8.0 6.5 - 8.1 g/dL   Albumin 4.4 3.5 - 5.0 g/dL   AST 21 15 - 41 U/L   ALT 23 17 - 63 U/L   Alkaline Phosphatase 70 38 - 126 U/L   Total Bilirubin 0.5 0.3 - 1.2 mg/dL   GFR calc non Af Amer >60 >60 mL/min   GFR calc Af Amer >60 >60 mL/min    Comment: (NOTE) The eGFR has been calculated using the CKD EPI equation. This calculation has not been validated in all clinical situations. eGFR's persistently <60 mL/min signify possible Chronic Kidney Disease.    Anion gap 8 5 - 15  Hemoglobin A1c     Status: Abnormal   Collection Time: 06/19/15 11:50 AM  Result Value Ref Range   Hgb A1c MFr Bld 7.1 (H) 4.0 - 6.0 %  Lipid panel     Status: Abnormal   Collection Time: 06/19/15 11:50 AM  Result Value Ref Range   Cholesterol 221 (H) 0 - 200 mg/dL   Triglycerides 181 (H) <150 mg/dL   HDL 48 >40 mg/dL  Total CHOL/HDL Ratio 4.6 RATIO   VLDL  36 0 - 40 mg/dL   LDL Cholesterol 137 (H) 0 - 99 mg/dL    Comment:        Total Cholesterol/HDL:CHD Risk Coronary Heart Disease Risk Table                     Men   Women  1/2 Average Risk   3.4   3.3  Average Risk       5.0   4.4  2 X Average Risk   9.6   7.1  3 X Average Risk  23.4   11.0        Use the calculated Patient Ratio above and the CHD Risk Table to determine the patient's CHD Risk.        ATP [name] CLASSIFICATION (LDL):  <100     mg/dL   Optimal  100-129  mg/dL   Near or Above                    Optimal  130-159  mg/dL   Borderline  160-189  mg/dL   High  >190     mg/dL   Very High   Lithium level     Status: Abnormal   Collection Time: 06/19/15 11:50 AM  Result Value Ref Range   Lithium Lvl 0.50 (L) 0.60 - 1.20 mmol/L  Glucose, capillary     Status: Abnormal   Collection Time: 06/19/15  4:33 PM  Result Value Ref Range   Glucose-Capillary 129 (H) 65 - 99 mg/dL   Comment 1 Notify RN   Glucose, capillary     Status: Abnormal   Collection Time: 06/20/15  6:53 AM  Result Value Ref Range   Glucose-Capillary 154 (H) 65 - 99 mg/dL    Physical Findings: AIMS: Facial and Oral Movements Muscles of Facial Expression: None, normal Lips and Perioral Area: None, normal Jaw: None, normal Tongue: None, normal,Extremity Movements Upper (arms, wrists, hands, fingers): None, normal Lower (legs, knees, ankles, toes): None, normal, Trunk Movements Neck, shoulders, hips: None, normal, Overall Severity Severity of abnormal movements (highest score from questions above): None, normal Incapacitation due to abnormal movements: None, normal Patient's awareness of abnormal movements (rate only patient's report): No Awareness, Dental Status Current problems with teeth and/or dentures?: Yes Does patient usually wear dentures?: No  CIWA:  CIWA-Ar Total: 3 COWS:  COWS Total Score: 1  Musculoskeletal: Strength & Muscle Tone: within normal limits Gait & Station: normal Patient  leans: N/A  Psychiatric Specialty Exam: Review of Systems  All other systems reviewed and are negative.   Blood pressure 145/63, pulse 70, temperature 98.6 F (37 C), temperature source Oral, resp. rate 20, height 6' (1.829 m), weight 85.276 kg (188 lb), SpO2 95 %.Body mass index is 25.49 kg/(m^2).  General Appearance: Casual  Eye Contact::  Good  Speech:  Clear and Coherent  Volume:  Normal  Mood:  Euphoric  Affect:  Appropriate  Thought Process:  Goal Directed  Orientation:  Full (Time, Place, and Person)  Thought Content:  Delusions and Paranoid Ideation  Suicidal Thoughts:  No  Homicidal Thoughts:  No  Memory:  Immediate;   Fair Recent;   Fair Remote;   Fair  Judgement:  Fair  Insight:  Fair  Psychomotor Activity:  Increased  Concentration:  Fair  Recall:  [employer] of Knowledge:Good  Language: Good  Akathisia:  No  Handed:  Right  AIMS (if indicated):  Assets:  Communication Skills Desire for Improvement Financial Resources/Insurance Housing Physical Health Resilience Social Support  ADL's:  Intact  Cognition: WNL  Sleep:  Number of Hours: 7.25   Treatment Plan Summary: Daily contact with patient to assess and evaluate symptoms and progress in treatment and Medication management   Julian [name] is a 59 year old male with history of paranoid schizophrenia admitted floridly psychotic in the context of treatment noncompliance.  1. Psychosis. The patient has been maintained for years on a combination of Invega and Lithium. He received an injection of 234 mg of Invega sustenna on 12/8. We restarted Lithium li 0.50. We added Cogentin for EPS.  2. Insomnia. He was started on trazodone.  3. Diabetes. The patient has a history of diabetes on and off metformin. We will check hemoglobin A1c and monitor his sugars. Hemoglobin A1c 7.1.  4. Foreign objects in the abdomen. Chest and abdomen x-rays from [facility] September 12 indicate presence of foreign bode, possibly  a magnet. No recommendations were provided by emergency room physicians. The patient refuses follow up X-ray. Will try again tomorrow and consult surgery.  5. Metabolic syndrome. It is slightly elevated. The patient wants to try diet first. TSH is normal.   6. Disposition. The patient will return to his group home. He will follow up with his ACT team. He is an incompetent adult and [place] is his guardian.  No medication adjustments were offered today 06/20/2015  Zakyla Tonche 06/20/2015, 2:19 PM  back.

## 2015-06-20 NOTE — Plan of Care (Signed)
Problem: Alteration in thought process Goal: LTG-Patient verbalizes understanding importance med regimen (Patient verbalizes understanding of importance of medication regimen and need to continue outpatient care.)  Outcome: Not Progressing Pt refused Trileptal, stating " I don't take the generic". There is no evidence of learning when attempting to verbalized the importance of following medication regimen fully.

## 2015-06-21 LAB — GLUCOSE, CAPILLARY
GLUCOSE-CAPILLARY: 106 mg/dL — AB (ref 65–99)
Glucose-Capillary: 165 mg/dL — ABNORMAL HIGH (ref 65–99)
Glucose-Capillary: 224 mg/dL — ABNORMAL HIGH (ref 65–99)

## 2015-06-21 MED ORDER — METFORMIN HCL 500 MG PO TABS
500.0000 mg | ORAL_TABLET | Freq: Two times a day (BID) | ORAL | Status: DC
  Administered 2015-06-21 – 2015-06-22 (×2): 500 mg via ORAL
  Filled 2015-06-21 (×2): qty 1

## 2015-06-21 MED ORDER — OXCARBAZEPINE 300 MG PO TABS: 300.0000 mg | ORAL_TABLET | Freq: Two times a day (BID) | ORAL | Status: DC

## 2015-06-21 MED ORDER — BENZTROPINE MESYLATE 1 MG PO TABS
0.5000 mg | ORAL_TABLET | Freq: Two times a day (BID) | ORAL | Status: DC
  Administered 2015-06-22 (×2): 0.5 mg via ORAL
  Filled 2015-06-21 (×2): qty 1

## 2015-06-21 MED ORDER — BENZTROPINE MESYLATE 0.5 MG PO TABS: 0.5000 mg | ORAL_TABLET | Freq: Two times a day (BID) | ORAL | Status: AC

## 2015-06-21 MED ORDER — PALIPERIDONE PALMITATE 234 MG/1.5ML IM SUSP: 234.0000 mg | INTRAMUSCULAR | Status: DC

## 2015-06-21 NOTE — [place] FL2 (Signed)
  [place] MEDICAID FL2 LEVEL OF CARE SCREENING TOOL     IDENTIFICATION  Patient Name: Julian [name] Birthdate: Aug 07, 1955 Sex: male Admission Date (Current Location): 06/16/2015  [place]   Facility and Address:  [facility], [address], [place], [place] [address]      Provider Number: [id number]  Attending Physician Name and Address:  Radene JourneyAndrea Hernandez, MD  Relative Name and Phone Number:  Vanessa KickGuardian Marty Purty [phone]    Current Level of Care: Hospital Recommended Level of Care: Assisted Living Facility/Group Home Prior Approval Number:    Date Approved/Denied:   PASRR Number:    Discharge Plan: Other (Comment) (Group Home)    Current Diagnoses: Patient Active Problem List   Diagnosis Date Noted  . Schizophrenia, paranoid (HCC) 06/14/2015    Orientation RESPIRATION BLADDER Height & Weight    Self, Situation, Place, Time  Normal   6' (182.9 cm) 188 lbs.  BEHAVIORAL SYMPTOMS/MOOD NEUROLOGICAL BOWEL NUTRITION STATUS           AMBULATORY STATUS COMMUNICATION OF NEEDS Skin   Independent   Normal                       Personal Care Assistance Level of Assistance              Functional Limitations Info             SPECIAL CARE FACTORS FREQUENCY        Contractures Contractures Info: Not present    Additional Factors Info  Psychotropic    See discharge summary medications           Current Medications (06/21/2015):  This is the current hospital active medication list Current Facility-Administered Medications  Medication Dose Route Frequency Provider Last Rate Last Dose  . acetaminophen (TYLENOL) tablet 650 mg  650 mg Oral Q6H PRN Audery AmelJohn T Clapacs, MD   650 mg at 06/20/15 1720  . alum & mag hydroxide-simeth (MAALOX/MYLANTA) 200-200-20 MG/5ML suspension 30 mL  30 mL Oral Q4H PRN Audery AmelJohn T Clapacs, MD      . benztropine (COGENTIN) tablet 1 mg  1 mg Oral Once Brandy HaleUzma  Faheem, MD   1 mg at 06/17/15 2259  . benztropine (COGENTIN) tablet 2 mg  2 mg Oral BID Shari ProwsJolanta B Pucilowska, MD   2 mg at 06/21/15 0933  . lithium carbonate capsule 300 mg  300 mg Oral TID WC Jolanta B Pucilowska, MD   300 mg at 06/21/15 0853  . LORazepam (ATIVAN) tablet 2 mg  2 mg Oral Q2H PRN Audery AmelJohn T Clapacs, MD      . magnesium hydroxide (MILK OF MAGNESIA) suspension 30 mL  30 mL Oral Daily PRN Audery AmelJohn T Clapacs, MD      . Oxcarbazepine (TRILEPTAL) tablet 300 mg  300 mg Oral BID Shari ProwsJolanta B Pucilowska, MD   300 mg at 06/21/15 0934  . [START ON 07/08/2015] paliperidone (INVEGA SUSTENNA) injection 234 mg  234 mg Intramuscular Q28 days Jolanta B Pucilowska, MD      . traZODone (DESYREL) tablet 100 mg  100 mg Oral QHS Audery AmelJohn T Clapacs, MD   100 mg at 06/20/15 2128     Discharge Medications: Please see discharge summary for a list of discharge medications.  Relevant Imaging Results:  Relevant Lab Results:   Additional Information   Aerik Polan, Cleda DaubSara P, LCSW, MSW, LCSW

## 2015-06-21 NOTE — Plan of Care (Signed)
Problem: Ineffective individual coping Goal: STG: Pt will be able to identify effective and ineffective STG: Pt will be able to identify effective and ineffective coping patterns  Outcome: Progressing Limited insight  Into behavior

## 2015-06-21 NOTE — [facility] Group Notes (Deleted)
[facility] LCSW Aftercare Discharge Planning Group Note  06/21/2015 9:15 AM  Participation Quality: Did Not Attend. Patient invited to participate but declined.   Nylia Gavina F. Rickayla Wieland, MSW, LCSWA, LCAS   

## 2015-06-21 NOTE — Progress Notes (Signed)
D: Patient loud and intrusive .Constantly talking about going to [place] and being able to talk to officials. , wanting to leave today incontinent  Of urine  Informed of discharge tomorrow ,appetite good , energy level normal . Interacting with peers. A: Encourage patient participation with unit programming . Instruction  Given on  Medication , verbalize understanding. R: Voice no other concerns. Staff continue to monitor

## 2015-06-21 NOTE — [facility] Group Notes (Signed)
[facility] LCSW Aftercare Discharge Planning Group Note  06/21/2015 9:15 AM  Participation Quality: Did Not Attend. Patient invited to participate but declined.   Venetia Prewitt F. Linnie Mcglocklin, MSW, LCSWA, LCAS   

## 2015-06-21 NOTE — Plan of Care (Signed)
Problem: Alteration in thought process Goal: STG-Patient is able to follow short directions Outcome: Progressing Pt follow staff directions

## 2015-06-21 NOTE — [facility] Group Notes (Signed)
[facility] LCSW Group Therapy   06/21/2015 1:15 pm  Type of Therapy: Group Therapy   Participation Level: Did Not Attend. Patient invited to participate but declined.    Chanita Boden F. Mikaylah Libbey, MSW, LCSWA, LCAS   

## 2015-06-21 NOTE — Progress Notes (Signed)
[facility] MD Progress Note  06/21/2015 3:49 PM Hulan Fessndrew C Oliveri III  MRN:  [id number]  Subjective:  Mr. Julian [name] has a history of schizoaffective disorder and was admitted in a manic episode. He left his group home in [place] area to tablet first visit his mother in [place] then to [place] to visit a friend. He stopped his medication and was admitted to [facility]. He accepted his lithium back. He is currently on [place]vega Sustenna injections. He has much improved. Even though he is still very delusional thinking of going to [place] for 3 years to work with [employer] and to [employer] [place], he is not as intrusive and manic. He was my patient several months ago and I believe that he is close to his baseline. He wants to be discharged at the beginning of the week. He will need transportation either from his guardian, ACT team, group home. He also awaits a letter that was sent by his mother from [place] and [place]10 bill. He expects us to send his many to him or back to his mother. He has a master's degree in physics. He got sick while in graduate school.  Mr. Julian [name] has much improved. He is only slightly intrusive. He is able to hold a conversation. He wants to return to his group home as soon as possible. He spoke with his mother on the phone. He accepts medication and tolerates them well. There are no somatic complaints. He is very active and social on the unit. Excellent group participation. Today he was intrusive asking what did I wanted for Christmas. He was easily redirectable. He is being pleasant. There is no evidence of agitation or aggression.  Per nursing: D: Pt observed in dayroom interacting with peers. Denies SI/HI/AVH at this time. Denies pain. Pt thought process continues to be disorganized. Pt refuses evening dose of Trileptal despite writer's encouragement, stating "I don't take the generic. I don't want all the side effects." A: Emotional support  and encouragement provided. Reinforced the importance of following prescribed medication regimen. q15 minute safety checks maintained. R: Pt takes other evening medications with encouragement. Pt remains free from harm.   Principal Problem: Schizophrenia, paranoid (HCC) Diagnosis:   Patient Active Problem List   Diagnosis Date Noted  . Schizophrenia, paranoid (HCC) [F20.0] 06/14/2015   Total Time spent with patient: 30 minutes  Past Psychiatric History: Schizophrenia.  Past Medical History:  Past Medical History  Diagnosis Date  . Diabetes mellitus without complication (HCC)   . Schizophrenia (HCC)    History reviewed. No pertinent past surgical history. Family History: History reviewed. No pertinent family history. Family Psychiatric  History: None reported. Social History:  History  Alcohol Use No     History  Drug Use No    Social History   Social History  . Marital Status: Married    Spouse Name: N/A  . Number of Children: N/A  . Years of Education: N/A   Social History Main Topics  . Smoking status: Current Every Day Smoker -- 1.00 packs/day    Types: Cigarettes  . Smokeless tobacco: None  . Alcohol Use: No  . Drug Use: No  . Sexual Activity: No   Other Topics Concern  . None   Social History Narrative    Sleep: Good  Appetite:  Good  Current Medications: Current Facility-Administered Medications  Medication Dose Route Frequency Provider Last Rate Last Dose  . acetaminophen (TYLENOL) tablet 650 mg  650 mg Oral  Q6H PRN Audery Amel, MD   650 mg at 06/20/15 1720  . alum & mag hydroxide-simeth (MAALOX/MYLANTA) 200-200-20 MG/5ML suspension 30 mL  30 mL Oral Q4H PRN Audery Amel, MD      . benztropine (COGENTIN) tablet 0.5 mg  0.5 mg Oral BID Jimmy Footman, MD      . lithium carbonate capsule 300 mg  300 mg Oral TID WC Jolanta B Pucilowska, MD   300 mg at 06/21/15 1252  . LORazepam (ATIVAN) tablet 2 mg  2 mg Oral Q2H PRN Audery Amel, MD       . magnesium hydroxide (MILK OF MAGNESIA) suspension 30 mL  30 mL Oral Daily PRN Audery Amel, MD      . metFORMIN (GLUCOPHAGE) tablet 500 mg  500 mg Oral BID WC Jimmy Footman, MD      . Oxcarbazepine (TRILEPTAL) tablet 300 mg  300 mg Oral BID Shari Prows, MD   300 mg at 06/21/15 0934  . [START ON 07/08/2015] paliperidone (INVEGA SUSTENNA) injection 234 mg  234 mg Intramuscular Q28 days Shari Prows, MD      . traZODone (DESYREL) tablet 100 mg  100 mg Oral QHS Audery Amel, MD   100 mg at 06/20/15 2128    Lab Results:  Results for orders placed or performed during the hospital encounter of 06/16/15 (from the past 48 hour(s))  Glucose, capillary     Status: Abnormal   Collection Time: 06/19/15  4:33 PM  Result Value Ref Range   Glucose-Capillary 129 (H) 65 - 99 mg/dL   Comment 1 Notify RN   Glucose, capillary     Status: Abnormal   Collection Time: 06/20/15  6:53 AM  Result Value Ref Range   Glucose-Capillary 154 (H) 65 - 99 mg/dL  Glucose, capillary     Status: Abnormal   Collection Time: 06/21/15  6:47 AM  Result Value Ref Range   Glucose-Capillary 165 (H) 65 - 99 mg/dL  Glucose, capillary     Status: Abnormal   Collection Time: 06/21/15 12:05 PM  Result Value Ref Range   Glucose-Capillary 224 (H) 65 - 99 mg/dL   Comment 1 Notify RN     Physical Findings: AIMS: Facial and Oral Movements Muscles of Facial Expression: None, normal Lips and Perioral Area: None, normal Jaw: None, normal Tongue: None, normal,Extremity Movements Upper (arms, wrists, hands, fingers): None, normal Lower (legs, knees, ankles, toes): None, normal, Trunk Movements Neck, shoulders, hips: None, normal, Overall Severity Severity of abnormal movements (highest score from questions above): None, normal Incapacitation due to abnormal movements: None, normal Patient's awareness of abnormal movements (rate only patient's report): No Awareness, Dental Status Current problems  with teeth and/or dentures?: Yes Does patient usually wear dentures?: No  CIWA:  CIWA-Ar Total: 3 COWS:  COWS Total Score: 1  Musculoskeletal: Strength & Muscle Tone: within normal limits Gait & Station: normal Patient leans: N/A  Psychiatric Specialty Exam: Review of Systems  Constitutional: Negative.   HENT: Negative.   Eyes: Negative.   Respiratory: Negative.   Cardiovascular: Negative.   Gastrointestinal: Negative.   Genitourinary: Negative.   Musculoskeletal: Negative.   Skin: Negative.   Neurological: Negative.   Endo/Heme/Allergies: Negative.   Psychiatric/Behavioral: Negative.   All other systems reviewed and are negative.   Blood pressure 127/82, pulse 65, temperature 98.2 F (36.8 C), temperature source Oral, resp. rate 20, height 6' (1.829 m), weight 85.276 kg (188 lb), SpO2 95 %.Body mass index is  25.49 kg/(m^2).  General Appearance: Casual  Eye Contact::  Good  Speech:  Clear and Coherent  Volume:  Normal  Mood:  Euphoric  Affect:  Appropriate  Thought Process:  Goal Directed  Orientation:  Full (Time, Place, and Person)  Thought Content:  Delusions and Paranoid Ideation  Suicidal Thoughts:  No  Homicidal Thoughts:  No  Memory:  Immediate;   Fair Recent;   Fair Remote;   Fair  Judgement:  Fair  Insight:  Fair  Psychomotor Activity:  Increased  Concentration:  Fair  Recall:  [employer] of Knowledge:Good  Language: Good  Akathisia:  No  Handed:  Right  AIMS (if indicated):     Assets:  Communication Skills Desire for Improvement Financial Resources/Insurance Housing Physical Health Resilience Social Support  ADL's:  Intact  Cognition: WNL  Sleep:  Number of Hours: 7.5   Treatment Plan Summary: Daily contact with patient to assess and evaluate symptoms and progress in treatment and Medication management   Mr. Kersh is a 59 year old male with history of paranoid schizophrenia admitted floridly psychotic in the context of treatment  noncompliance.  1. Psychosis. The patient has been maintained for years on a combination of Invega and Lithium. He received an injection of 234 mg of Invega sustenna on 12/8. Next inj on Jan 5th. We restarted Lithium 300 mg tid.  li 0.50. We added Cogentin for EPS 0.5 bid  2. Insomnia. He was started on trazodone.  3. Diabetes: Hemoglobin A1c 7.1. Will start metformin.  4. Foreign objects in the abdomen. Chest and abdomen x-rays from [facility] September 12 indicate presence of foreign bode, possibly a magnet. No recommendations were provided by emergency room physicians. Will check X-ray today.  5. Metabolic syndrome. It is slightly elevated. The patient wants to try diet first. TSH is normal.   6. Disposition. The patient will return to his group home. He will follow up with his ACT team. He is an incompetent adult and [facility] DSS is his guardian.  Possible d/c tomorrow. SW has been attempting to contact [facility] today to arrange for d/c  Jimmy Footman 06/21/2015, 3:49 PM  back.

## 2015-06-21 NOTE — Progress Notes (Signed)
D: Pt observed in dayroom interacting with peers. Denies SI/HI/AVH at this time. Denies pain. Pt thought process continues to be disorganized. Pt refuses evening dose of Trileptal despite writer's encouragement, stating "I don't take the generic. I don't want all the side effects." A: Emotional support and encouragement provided. Reinforced the importance of following prescribed medication regimen. q15 minute safety checks maintained. R: Pt takes other evening medications with encouragement. Pt remains free from harm.

## 2015-06-21 NOTE — [facility] Group Notes (Signed)
[facility] Group Notes:  (Nursing/MHT/Case Management/Adjunct)  Date:  06/21/2015  Time:  1:44 PM  Type of Therapy:  Group Therapy  Participation Level:  Active  Participation Quality:  Appropriate  Affect:  Appropriate  Cognitive:  Appropriate  Insight:  Appropriate  Engagement in Group:  Limited  Modes of Intervention:  Discussion and Support  Summary of Progress/Problems:  Julian [name] 06/21/2015, 1:44 PM

## 2015-06-22 ENCOUNTER — Encounter: Payer: Self-pay | Admitting: Psychiatry

## 2015-06-22 DIAGNOSIS — F250 Schizoaffective disorder, bipolar type: Secondary | ICD-10-CM

## 2015-06-22 NOTE — [facility] Suicide Risk Assessment (Signed)
[facility]   Demographic Factors:  Male  Total Time spent with patient: 30 minutes    Psychiatric Specialty Exam: Physical Exam  ROS                                                         Have you used any form of tobacco in the last 30 days? (Cigarettes, Smokeless Tobacco, Cigars, and/or Pipes): Yes  Has this patient used any form of tobacco in the last 30 days? (Cigarettes, Smokeless Tobacco, Cigars, and/or Pipes) Yes, A prescription for an FDA-approved tobacco cessation medication was offered at discharge and the patient refused  Mental Status Per Nursing Assessment::   On Admission:     Current Mental Status by Physician: Patient is pleasant, calm, cooperative. Compliant with meds. Nice SI, HI or auditory or visual hallucinations. He remains delusional which is his baseline..  Loss Factors: NA  Historical Factors: Impulsivity  Risk Reduction Factors:   Sense of responsibility to family, Living with another person, especially a relative and Positive social support  Continued Clinical Symptoms:  Currently Psychotic Previous Psychiatric Diagnoses and Treatments  Cognitive Features That Contribute To Risk:  Closed-mindedness    Suicide Risk:  Minimal: No identifiable suicidal ideation.  Patients presenting with no risk factors but with morbid ruminations; may be classified as minimal risk based on the severity of the depressive symptoms  Principal Problem: Schizophrenia, paranoid [facility](HCC) Discharge Diagnoses:  Patient Active Problem List   Diagnosis Date Noted  . Schizophrenia, paranoid (HCC) [F20.0] 06/14/2015    Follow-up Information    Follow up with [facility]  On 06/26/2015.   Why:  Hospital Follow up, Outpatient Medication Management, Therapy, ACT team staff will likely pick up orr will follow up within appropriate time frame of discharge.   Contact information:   [phone] Fax-[phone]        Is patient on multiple antipsychotic therapies at discharge:  No   Has Patient had three or more failed trials of antipsychotic monotherapy by history:  No  Recommended Plan for Multiple Antipsychotic Therapies: NA    Jimmy FootmanHernandez-Gonzalez,  Mauri Tolen 06/22/2015, 11:15 AM
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
## 2015-06-22 NOTE — [facility] Suicide Risk Assessment (Signed)
[facility] INPATIENT:  Family/Significant Other Suicide Prevention Education  Suicide Prevention Education:  Education Completed; Guardian Graciela HusbandsMarty Purty,  (name of family member/significant other) has been identified by the patient as the family member/significant other with whom the patient will be residing, and identified as the person(s) who will aid the patient in the event of a mental health crisis (suicidal ideations/suicide attempt).  With written consent from the patient, the family member/significant other has been provided the following suicide prevention education, prior to the and/or following the discharge of the patient.  The suicide prevention education provided includes the following:  Suicide risk factors  Suicide prevention and interventions  National Suicide Hotline telephone number  [facility] assessment telephone number  [facility] Emergency Assistance 911  [facility] and/or Residential Mobile Crisis Unit telephone number  Request made of family/significant other to:  Remove weapons (e.g., guns, rifles, knives), all items previously/currently identified as safety concern.    Remove drugs/medications (over-the-counter, prescriptions, illicit drugs), all items previously/currently identified as a safety concern.  The family member/significant other verbalizes understanding of the suicide prevention education information provided.  The family member/significant other agrees to remove the items of safety concern listed above.  Glennon MacLaws, Miasia Crabtree P, MSW, LCSW 06/22/2015, 4:36 PM

## 2015-06-22 NOTE — Progress Notes (Signed)
D: Pt approached writer asking about medications. Patient alert and oriented x4. Patient denies SI/HI/AVH. Pt affect anxious and apprehensive. Pt thought process is disorganized and tangential with delusion of grandeur. When medications were presented to pt he stated "I don't want any of that" and refused medications. 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 Pt came to writer at 1245 am asking for night medications. Pt c/o of difficulty sleeping, pt woke a few times in the night asking for more sleep medication.  A: Offered active listening and support. Provided therapeutic communication. Encouraged pt to follow medication regimen. Later, administered scheduled medications . Educated pt on good sleep hygiene R: Pt eventually fell asleep. Pt calm and cooperative. Will continue Q15 min. checks. Safety maintained.

## 2015-06-22 NOTE — Progress Notes (Signed)
  [facility]  Will you be returning to the same living situation after discharge:  Yes,    At discharge, do you have transportation home?: Yes,   Group home to transport Do you have the ability to pay for your medications: Yes,     Release of information consent forms completed and in the chart;  Patient's signature needed at discharge.  Patient to Follow up at: Follow-up Information    Follow up with [facility]  On 06/26/2015.   Why:  Hospital Follow up, Outpatient Medication Management, Therapy, ACT team staff will likely pick up orr will follow up within appropriate time frame of discharge.   Contact information:   [phone] Fax-[phone]      Next level of care provider has access to [facility] Link:no  Safety Planning and Suicide Prevention discussed: Yes,     Have you used any form of tobacco in the last 30 days? (Cigarettes, Smokeless Tobacco, Cigars, and/or Pipes): Yes  Has patient been referred to the Quitline?: Patient refused referral  Patient has been referred for addiction treatment: N/A  Glennon MacLaws, Yesmin Mutch P, MSW, LCSW 06/22/2015, 4:39 PM

## 2015-06-22 NOTE — [facility] Group Notes (Signed)
[facility] Group Notes:  (Nursing/MHT/Case Management/Adjunct)  Date:  06/22/2015  Time:  1:35 AM  Type of Therapy:  Group Therapy  Participation Level:  Active  Participation Quality:  Appropriate  Affect:  Appropriate  Cognitive:  Appropriate  Insight:  Good  Engagement in Group:  Engaged  Modes of Intervention:  n/a  Summary of Progress/Problems:  Veva Holesshley Imani Sinead Hockman 06/22/2015, 1:35 AM

## 2015-06-22 NOTE — Plan of Care (Signed)
Problem: Alteration in thought process Goal: LTG-Patient verbalizes understanding importance med regimen (Patient verbalizes understanding of importance of medication regimen and need to continue outpatient care.)  Outcome: Not Progressing Pt refused medication at scheduled time, but approached writer at 1245 am asking for medications.

## 2015-06-22 NOTE — Progress Notes (Signed)
D:Patient aware of discharge this shift . Patient returning to group home . Patient received all belonging locked up . Patient denies  Suicidal  And homicidal ideations  .  A: Writer instructed on discharge criteria  .Received  prescriptions  given to group home staff . Aware  Of follow up appointment . R: Patient left unit with no questions  Or concerns  With group home staff.

## 2016-08-30 ENCOUNTER — Emergency Department
Admission: EM | Admit: 2016-08-30 | Discharge: 2016-08-31 | Disposition: A | Discharge status: Home / Self Care | Payer: Medicare Other | Attending: Student in an Organized Health Care Education/Training Program | Admitting: Student in an Organized Health Care Education/Training Program

## 2016-08-30 DIAGNOSIS — F203 Undifferentiated schizophrenia: Secondary | ICD-10-CM | POA: Diagnosis not present

## 2016-08-30 DIAGNOSIS — Z79899 Other long term (current) drug therapy: Secondary | ICD-10-CM | POA: Insufficient documentation

## 2016-08-30 DIAGNOSIS — F17210 Nicotine dependence, cigarettes, uncomplicated: Secondary | ICD-10-CM | POA: Diagnosis not present

## 2016-08-30 DIAGNOSIS — Z7984 Long term (current) use of oral hypoglycemic drugs: Secondary | ICD-10-CM | POA: Diagnosis not present

## 2016-08-30 DIAGNOSIS — F209 Schizophrenia, unspecified: Secondary | ICD-10-CM

## 2016-08-30 DIAGNOSIS — F250 Schizoaffective disorder, bipolar type: Secondary | ICD-10-CM | POA: Diagnosis present

## 2016-08-30 DIAGNOSIS — E119 Type 2 diabetes mellitus without complications: Secondary | ICD-10-CM | POA: Diagnosis not present

## 2016-08-30 DIAGNOSIS — Z046 Encounter for general psychiatric examination, requested by authority: Secondary | ICD-10-CM | POA: Diagnosis present

## 2016-08-30 LAB — URINE DRUG SCREEN, QUALITATIVE (ARMC ONLY)
AMPHETAMINES, UR SCREEN: NOT DETECTED
BARBITURATES, UR SCREEN: NOT DETECTED
BENZODIAZEPINE, UR SCRN: NOT DETECTED
COCAINE METABOLITE, UR ~~LOC~~: NOT DETECTED
Cannabinoid 50 Ng, Ur ~~LOC~~: NOT DETECTED
MDMA (Ecstasy)Ur Screen: NOT DETECTED
METHADONE SCREEN, URINE: NOT DETECTED
Opiate, Ur Screen: NOT DETECTED
Phencyclidine (PCP) Ur S: NOT DETECTED
TRICYCLIC, UR SCREEN: NOT DETECTED

## 2016-08-30 LAB — COMPREHENSIVE METABOLIC PANEL
ALT: 13 U/L — ABNORMAL LOW (ref 17–63)
ANION GAP: 10 (ref 5–15)
AST: 16 U/L (ref 15–41)
Albumin: 4.5 g/dL (ref 3.5–5.0)
Alkaline Phosphatase: 71 U/L (ref 38–126)
BILIRUBIN TOTAL: 0.5 mg/dL (ref 0.3–1.2)
BUN: 13 mg/dL (ref 6–20)
CHLORIDE: 103 mmol/L (ref 101–111)
CO2: 26 mmol/L (ref 22–32)
Calcium: 9.8 mg/dL (ref 8.9–10.3)
Creatinine, Ser: 0.74 mg/dL (ref 0.61–1.24)
Glucose, Bld: 190 mg/dL — ABNORMAL HIGH (ref 65–99)
POTASSIUM: 3.9 mmol/L (ref 3.5–5.1)
Sodium: 139 mmol/L (ref 135–145)
TOTAL PROTEIN: 8.3 g/dL — AB (ref 6.5–8.1)

## 2016-08-30 LAB — CBC
HCT: 39.2 % — ABNORMAL LOW (ref 40.0–52.0)
HEMOGLOBIN: 13.8 g/dL (ref 13.0–18.0)
MCH: 31.2 pg (ref 26.0–34.0)
MCHC: 35.1 g/dL (ref 32.0–36.0)
MCV: 88.8 fL (ref 80.0–100.0)
Platelets: 380 10*3/uL (ref 150–440)
RBC: 4.42 MIL/uL (ref 4.40–5.90)
RDW: 13.4 % (ref 11.5–14.5)
WBC: 8.9 10*3/uL (ref 3.8–10.6)

## 2016-08-30 LAB — ETHANOL

## 2016-08-30 LAB — LITHIUM LEVEL

## 2016-08-30 MED ORDER — SODIUM CHLORIDE 0.9 % IV BOLUS (SEPSIS): 1000.0000 mL | Freq: Once | INTRAVENOUS | Status: DC

## 2016-08-30 MED ORDER — ATORVASTATIN CALCIUM 20 MG PO TABS: 20.0000 mg | ORAL_TABLET | Freq: Every day | ORAL | Status: DC

## 2016-08-30 MED ORDER — BENZTROPINE MESYLATE 1 MG PO TABS
0.5000 mg | ORAL_TABLET | Freq: Two times a day (BID) | ORAL | Status: DC
  Administered 2016-08-30 – 2016-08-31 (×2): 0.5 mg via ORAL
  Filled 2016-08-30 (×2): qty 1

## 2016-08-30 MED ORDER — OXCARBAZEPINE 300 MG PO TABS: 300.0000 mg | ORAL_TABLET | Freq: Two times a day (BID) | ORAL | Status: DC

## 2016-08-30 MED ORDER — LITHIUM CARBONATE ER 450 MG PO TBCR
450.0000 mg | EXTENDED_RELEASE_TABLET | Freq: Two times a day (BID) | ORAL | Status: DC
  Filled 2016-08-30: qty 1

## 2016-08-30 MED ORDER — METFORMIN HCL 500 MG PO TABS
500.0000 mg | ORAL_TABLET | Freq: Two times a day (BID) | ORAL | Status: DC
  Administered 2016-08-30 – 2016-08-31 (×2): 500 mg via ORAL
  Filled 2016-08-30 (×2): qty 1

## 2016-08-30 MED ORDER — AMLODIPINE BESYLATE 5 MG PO TABS: 5.0000 mg | ORAL_TABLET | Freq: Every day | ORAL | Status: DC

## 2016-08-30 MED ORDER — OLANZAPINE 10 MG PO TABS
10.0000 mg | ORAL_TABLET | Freq: Two times a day (BID) | ORAL | Status: DC
  Administered 2016-08-31: 10 mg via ORAL
  Filled 2016-08-30: qty 1

## 2016-08-30 MED ORDER — OLANZAPINE 10 MG PO TABS
10.0000 mg | ORAL_TABLET | Freq: Every day | ORAL | Status: DC
  Administered 2016-08-30: 10 mg via ORAL
  Filled 2016-08-30: qty 1

## 2016-08-30 MED ORDER — METFORMIN HCL 500 MG PO TABS: 500.0000 mg | ORAL_TABLET | Freq: Two times a day (BID) | ORAL | Status: DC

## 2016-08-30 MED ORDER — TRAZODONE HCL 100 MG PO TABS: 100.0000 mg | ORAL_TABLET | Freq: Every day | ORAL | Status: DC

## 2016-08-30 MED ORDER — DIPHENHYDRAMINE HCL 25 MG PO CAPS
50.0000 mg | ORAL_CAPSULE | Freq: Two times a day (BID) | ORAL | Status: DC
  Administered 2016-08-30 – 2016-08-31 (×2): 50 mg via ORAL
  Filled 2016-08-30 (×2): qty 2
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
## 2016-08-30 NOTE — ED Notes (Signed)
Gave pt blanket
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
## 2016-08-30 NOTE — ED Notes (Signed)
Gave pt food tray and juice

## 2016-08-31 DIAGNOSIS — F250 Schizoaffective disorder, bipolar type: Secondary | ICD-10-CM | POA: Diagnosis not present

## 2016-08-31 DIAGNOSIS — F203 Undifferentiated schizophrenia: Secondary | ICD-10-CM | POA: Diagnosis not present

## 2016-08-31 MED ORDER — ACETAMINOPHEN 325 MG PO TABS
650.0000 mg | ORAL_TABLET | Freq: Four times a day (QID) | ORAL | Status: DC | PRN
  Administered 2016-08-31: 650 mg via ORAL
  Filled 2016-08-31: qty 2

## 2016-08-31 NOTE — ED Notes (Signed)
[address] LeamingtonHairst[place], Julian [name]:  [phone]

## 2016-08-31 NOTE — BH Assessment (Signed)
Clinician consulted with Dr. Clapacs who recommends that pt be observed overnight and evaluated by psychiatry in the morning.  

## 2016-08-31 NOTE — ED Notes (Signed)
Pt left in no acute distress. Hospital stay reviewed with Ms. Hairston, [facility] rep and AVS was provided. She verbalized understanding and offered no questions or concerns. Pt ambulated out the ED entrance independently with Ms. Hairston.
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
## 2016-08-31 NOTE — ED Notes (Signed)
Patient anxious.  Expressing desire to leave hospital and go to the Shelter.  Discussed immediate plan of care (i.e. To see Psychiatry).  Patient requesting to speak to his mother.  Called his mother and patient spoke with her.  Patient's mother encouraging him to stay at the hospital, and patient acknowledges that he will stay at the hospital to be seen by Psychiatry.

## 2016-08-31 NOTE — BH Assessment (Signed)
Writer spoke Group Home ([phone]) and informed them the patient was seen by Psych MD and was going to be discharged. Writer also informed them, what the patient had shared with ER staff his chief complaint for why he was brought to the ER. Staff stated they will have to call back with a time for pick up, because they had to call their manager. The manager is the one who arrange the transportation. Writer provider the Group Home staff member with the number to the nurse's station [phone]) for the return phone call.
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
## 2016-08-31 NOTE — ED Notes (Signed)
Pt sleeping. 

## 2016-08-31 NOTE — ED Provider Notes (Signed)
Vitals:   08/30/16 2235 08/31/16 0605  BP: 135/71 131/84  Pulse: 80 71  Resp: 20 18  Temp: 97.8 F (36.6 C) 98.1 F (36.7 C)     Patient seen, cleared for discharge by Dr. Toni Amendlapacs. We'll be returning to his group home.   Sharyn CreamerMark Alford Gamero, MD 08/31/16 1331

## 2016-08-31 NOTE — ED Notes (Signed)
Maureen RalphsVivian (patient's mother)  [phone]

## 2016-08-31 NOTE — Discharge Instructions (Signed)

## 2016-09-16 ENCOUNTER — Encounter: Payer: Self-pay | Admitting: Emergency Medicine

## 2016-09-16 ENCOUNTER — Emergency Department
Admission: EM | Admit: 2016-09-16 | Discharge: 2016-09-19 | Disposition: A | Discharge status: Other Acute Inpatient Hospital | Payer: Medicare Other | Attending: Emergency Medicine | Admitting: Emergency Medicine

## 2016-09-16 DIAGNOSIS — Z79899 Other long term (current) drug therapy: Secondary | ICD-10-CM | POA: Insufficient documentation

## 2016-09-16 DIAGNOSIS — F250 Schizoaffective disorder, bipolar type: Secondary | ICD-10-CM | POA: Diagnosis not present

## 2016-09-16 DIAGNOSIS — K0889 Other specified disorders of teeth and supporting structures: Secondary | ICD-10-CM | POA: Insufficient documentation

## 2016-09-16 DIAGNOSIS — K047 Periapical abscess without sinus: Secondary | ICD-10-CM

## 2016-09-16 DIAGNOSIS — F17210 Nicotine dependence, cigarettes, uncomplicated: Secondary | ICD-10-CM | POA: Diagnosis not present

## 2016-09-16 DIAGNOSIS — Z7984 Long term (current) use of oral hypoglycemic drugs: Secondary | ICD-10-CM | POA: Insufficient documentation

## 2016-09-16 DIAGNOSIS — F29 Unspecified psychosis not due to a substance or known physiological condition: Secondary | ICD-10-CM | POA: Diagnosis not present

## 2016-09-16 DIAGNOSIS — E119 Type 2 diabetes mellitus without complications: Secondary | ICD-10-CM | POA: Insufficient documentation

## 2016-09-16 LAB — URINE DRUG SCREEN, QUALITATIVE (ARMC ONLY)
Amphetamines, Ur Screen: NOT DETECTED
Barbiturates, Ur Screen: NOT DETECTED
Benzodiazepine, Ur Scrn: NOT DETECTED
COCAINE METABOLITE, UR ~~LOC~~: NOT DETECTED
Cannabinoid 50 Ng, Ur ~~LOC~~: NOT DETECTED
MDMA (ECSTASY) UR SCREEN: NOT DETECTED
Methadone Scn, Ur: NOT DETECTED
OPIATE, UR SCREEN: NOT DETECTED
Phencyclidine (PCP) Ur S: NOT DETECTED
TRICYCLIC, UR SCREEN: NOT DETECTED

## 2016-09-16 LAB — CBC
HCT: 39.2 % — ABNORMAL LOW (ref 40.0–52.0)
Hemoglobin: 13.5 g/dL (ref 13.0–18.0)
MCH: 30.5 pg (ref 26.0–34.0)
MCHC: 34.5 g/dL (ref 32.0–36.0)
MCV: 88.4 fL (ref 80.0–100.0)
PLATELETS: 346 10*3/uL (ref 150–440)
RBC: 4.43 MIL/uL (ref 4.40–5.90)
RDW: 13.3 % (ref 11.5–14.5)
WBC: 8.5 10*3/uL (ref 3.8–10.6)

## 2016-09-16 LAB — COMPREHENSIVE METABOLIC PANEL
ALBUMIN: 4.5 g/dL (ref 3.5–5.0)
ALT: 10 U/L — AB (ref 17–63)
AST: 20 U/L (ref 15–41)
Alkaline Phosphatase: 66 U/L (ref 38–126)
Anion gap: 11 (ref 5–15)
BUN: 13 mg/dL (ref 6–20)
CHLORIDE: 102 mmol/L (ref 101–111)
CO2: 26 mmol/L (ref 22–32)
CREATININE: 0.63 mg/dL (ref 0.61–1.24)
Calcium: 9.5 mg/dL (ref 8.9–10.3)
GFR calc Af Amer: >60 mL/min (ref >60)
GFR calc non Af Amer: >60 mL/min (ref >60)
GLUCOSE: 205 mg/dL — AB (ref 65–99)
POTASSIUM: 3.5 mmol/L (ref 3.5–5.1)
SODIUM: 139 mmol/L (ref 135–145)
Total Bilirubin: 0.3 mg/dL (ref 0.3–1.2)
Total Protein: 8.1 g/dL (ref 6.5–8.1)

## 2016-09-16 MED ORDER — PENICILLIN V POTASSIUM 500 MG PO TABS
500.0000 mg | ORAL_TABLET | Freq: Four times a day (QID) | ORAL | Status: DC
  Administered 2016-09-17 – 2016-09-19 (×9): 500 mg via ORAL
  Filled 2016-09-16: qty 1
  Filled 2016-09-16: qty 2
  Filled 2016-09-16 (×4): qty 1
  Filled 2016-09-16: qty 2
  Filled 2016-09-16: qty 1
  Filled 2016-09-16: qty 2
  Filled 2016-09-16 (×3): qty 1

## 2016-09-16 MED ORDER — ACETAMINOPHEN 325 MG PO TABS
650.0000 mg | ORAL_TABLET | Freq: Four times a day (QID) | ORAL | Status: DC | PRN
  Administered 2016-09-17 (×2): 650 mg via ORAL
  Filled 2016-09-16 (×2): qty 2

## 2016-09-16 NOTE — ED Notes (Addendum)
Spoke with Pervis Hockingelma Harrison manager of [facility] at [phone] who reports pt has been acting more psychotic over the last week. She reports the patient did have an appointment at the dentist on the 13th but the office was closed due to weather. Pt however pulled his own tooth out yesterday. She reports patient keeps talking about having a job in [place] and that the patient has told her he knows she is running a Chiropractor"cat house". Ms. Romeo AppleHarrison will [facility] and information on his legal guardian.
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
## 2016-09-16 NOTE — ED Provider Notes (Signed)
[facility] Emergency Department Provider Note  Time seen: 10:18 PM  I have reviewed the triage vital signs and the nursing notes.   HISTORY  Chief Complaint Depression and Dental Pain    HPI Julian [name] is a 61 y.o. male with a past medical history of diabetes, schizophrenia, presents from his group home complaining of dental pain. However when we discussed the patient with a group home they state the patient has been increasingly psychotic saying that he is going to [place] he has been contacted by [employer]. [place] in the joint [employer]. When discussed in the emergency department the patient states the same thing he is going to [place], states he has been contacted by officials over there from the [employer]. Patient also states left frontal incisor fell out several days ago and has had increased pain and tenderness to this area.  Past Medical History:  Diagnosis Date  . Diabetes mellitus without complication (HCC)   . Schizophrenia [facility])     Patient Active Problem List   Diagnosis Date Noted  . Schizoaffective disorder, bipolar type (HCC) 06/22/2015    History reviewed. No pertinent surgical history.  Prior to Admission medications   Medication Sig Start Date End Date Taking? Authorizing Provider  benztropine (COGENTIN) 0.5 MG tablet Take 1 tablet (0.5 mg total) by mouth 2 (two) times daily. 06/21/15   Jimmy Footman, MD  diphenhydrAMINE (BENADRYL) 50 MG tablet Take 50 mg by mouth 2 (two) times daily.    Historical Provider, MD  metFORMIN (GLUCOPHAGE) 500 MG tablet Take 500 mg by mouth 2 (two) times daily.    Historical Provider, MD  OLANZapine (ZYPREXA) 10 MG tablet Take 10 mg by mouth 2 (two) times daily.    Historical Provider, MD  Oxcarbazepine (TRILEPTAL) 300 MG tablet Take 1 tablet (300 mg total) by mouth 2 (two) times daily. Patient not taking: Reported on 08/30/2016 06/21/15   Jimmy Footman, MD  paliperidone (INVEGA SUSTENNA) 234 MG/1.5ML SUSP injection Inject 234 mg into the muscle every 30 (thirty) days. Patient not taking: Reported on 08/30/2016 06/21/15   Jimmy Footman, MD    Allergies  Allergen Reactions  . Clozaril [Clozapine]     Seizure and cardiac arrest  . Haldol [Haloperidol Lactate]     seizures  . Prolixin [Fluphenazine]     seizures    History reviewed. No pertinent family history.  Social History Social History  Substance Use Topics  . Smoking status: Current Every Day Smoker    Packs/day: 1.00    Types: Cigarettes  . Smokeless tobacco: Never Used  . Alcohol use No    Review of Systems Constitutional: Negative for fever. ENT: Dental pain Cardiovascular: Negative for chest pain. Respiratory: Negative for shortness of breath. Gastrointestinal: Negative for abdominal pain Neurological: Negative for headache 10-point ROS otherwise negative.  ____________________________________________   PHYSICAL EXAM:  VITAL SIGNS: ED Triage Vitals  Enc Vitals Group     BP 09/16/16 2052 (!) 162/95     Pulse Rate 09/16/16 2052 (!) 108     Resp 09/16/16 2052 18     Temp 09/16/16 2052 98.2 F (36.8 C)     Temp Source 09/16/16 2052 Oral     SpO2 09/16/16 2052 96 %     Weight 09/16/16 2053 200 lb (90.7 kg)     Height 09/16/16 2053 6' (1.829 m)     Head Circumference --      Peak Flow --  Pain Score 09/16/16 2053 8     Pain Loc --      Pain Edu? --      Excl. in GC? --    Constitutional: Alert and oriented. Well appearing and in no distress. Eyes: Normal exam ENT   Head: Normocephalic and atraumatic.   Mouth/Throat: Mucous membranes are moist.Patient does have overall poor dentition. His left frontal incisor is broken off appears to be decayed root. No sign of abscess. Cardiovascular: Normal rate, regular rhythm. No murmur Respiratory: Normal respiratory effort without tachypnea nor retractions. Breath sounds  are clear  Gastrointestinal: Soft and nontender. No distention.  Musculoskeletal: Nontender with normal range of motion in all extremities.  Neurologic:  Normal speech and language. No gross focal neurologic deficits  Skin:  Skin is warm, dry and intact.  Psychiatric: Mood and affect are normal. Speech and behavior are normal.   ____________________________________________   INITIAL IMPRESSION / ASSESSMENT AND PLAN / ED COURSE  Pertinent labs & imaging results that were available during my care of the patient were reviewed by me and considered in my medical decision making (see chart for details).  The patient presents to the emergency department with dental pain but also increased psychosis per group home. Here the patient is also acting psychotic saying he has been contacted by [place] officials to work for the [employer], [employer]. 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 However the patient currently lives at a group home and is not employed. We will place the patient under an involuntary commitment and she is attempting to leave the emergency department. We'll have the patient evaluated by psychiatry. As far as the patient's medical complaints we'll place the patient on a 10 day course of penicillin and have Tylenol to be used as needed for discomfort.  ____________________________________________   FINAL CLINICAL IMPRESSION(S) / ED DIAGNOSES  Psychosis Dental pain   Minna AntisKevin Tyriek Hofman, MD 09/16/16 2220

## 2016-09-16 NOTE — ED Notes (Signed)
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
## 2016-09-16 NOTE — ED Notes (Signed)
BEHAVIORAL HEALTH ROUNDING  Patient sleeping: No.  Patient alert and oriented: yes  Behavior appropriate: Yes. ; If no, describe:  Nutrition and fluids offered: Yes  Toileting and hygiene offered: Yes  Sitter present: not applicable, Q 15 min safety rounds and observation.  Law enforcement present: Yes ODS  

## 2016-09-16 NOTE — ED Triage Notes (Addendum)
[facility]. Per group home patient is psychotic. Patient states that he is depressed. Patient with complaint of dental pain after a tooth falling out yesterday. Per ems bp 125 /82, hr 100 and 91%. FSBS 292.

## 2016-09-16 NOTE — ED Notes (Signed)
Verbal report given to Julian HatchAnn, RN; pt says he is here for his toothache pain; says the pain is making him depressed; pt is refusing to dress out; [facility] to see the MD

## 2016-09-17 DIAGNOSIS — F29 Unspecified psychosis not due to a substance or known physiological condition: Secondary | ICD-10-CM | POA: Diagnosis not present

## 2016-09-17 LAB — SALICYLATE LEVEL: Salicylate Lvl: <7 mg/dL (ref 2.8–30.0)

## 2016-09-17 LAB — GLUCOSE, CAPILLARY: GLUCOSE-CAPILLARY: 223 mg/dL — AB (ref 65–99)

## 2016-09-17 LAB — URINALYSIS, COMPLETE (UACMP) WITH MICROSCOPIC
BILIRUBIN URINE: NEGATIVE
Bacteria, UA: NONE SEEN
HGB URINE DIPSTICK: NEGATIVE
Ketones, ur: NEGATIVE mg/dL
LEUKOCYTES UA: NEGATIVE
NITRITE: NEGATIVE
PH: 5 (ref 5.0–8.0)
Protein, ur: NEGATIVE mg/dL
RBC / HPF: NONE SEEN RBC/hpf (ref 0–5)
SPECIFIC GRAVITY, URINE: 1.017 (ref 1.005–1.030)

## 2016-09-17 LAB — ACETAMINOPHEN LEVEL

## 2016-09-17 LAB — ETHANOL

## 2016-09-17 MED ORDER — METFORMIN HCL 500 MG PO TABS
500.0000 mg | ORAL_TABLET | Freq: Once | ORAL | Status: AC
  Administered 2016-09-17: 500 mg via ORAL

## 2016-09-17 MED ORDER — NICOTINE 21 MG/24HR TD PT24
21.0000 mg | MEDICATED_PATCH | Freq: Once | TRANSDERMAL | Status: AC
  Administered 2016-09-17: 21 mg via TRANSDERMAL
  Filled 2016-09-17: qty 1

## 2016-09-17 MED ORDER — METFORMIN HCL 500 MG PO TABS
500.0000 mg | ORAL_TABLET | Freq: Two times a day (BID) | ORAL | Status: DC
Start: 2016-09-17 — End: 2016-09-19
  Administered 2016-09-17 – 2016-09-19 (×4): 500 mg via ORAL
  Filled 2016-09-17 (×4): qty 1

## 2016-09-17 MED ORDER — OLANZAPINE 10 MG PO TABS
10.0000 mg | ORAL_TABLET | Freq: Two times a day (BID) | ORAL | Status: DC
  Administered 2016-09-17 – 2016-09-19 (×5): 10 mg via ORAL
  Filled 2016-09-17 (×5): qty 1

## 2016-09-17 MED ORDER — OLANZAPINE 10 MG IM SOLR
5.0000 mg | Freq: Two times a day (BID) | INTRAMUSCULAR | Status: DC | PRN
  Filled 2016-09-17: qty 10

## 2016-09-17 MED ORDER — METFORMIN HCL 500 MG PO TABS
ORAL_TABLET | ORAL | Status: AC
  Filled 2016-09-17: qty 1

## 2016-09-17 MED ORDER — DIPHENHYDRAMINE HCL 25 MG PO CAPS
50.0000 mg | ORAL_CAPSULE | Freq: Two times a day (BID) | ORAL | Status: DC
  Administered 2016-09-17 – 2016-09-19 (×5): 50 mg via ORAL
  Filled 2016-09-17 (×6): qty 2

## 2016-09-17 MED ORDER — BENZTROPINE MESYLATE 1 MG PO TABS
0.5000 mg | ORAL_TABLET | Freq: Two times a day (BID) | ORAL | Status: DC
  Administered 2016-09-17 – 2016-09-19 (×5): 0.5 mg via ORAL
  Filled 2016-09-17 (×5): qty 1

## 2016-09-17 NOTE — ED Notes (Signed)
Pt asking for nicotine gum. MD informed and will order patch due to patient having dental pain.

## 2016-09-17 NOTE — Progress Notes (Signed)
Julian [name] Legal Guardian is DSS Guardian, [phone] His guardian actively looking to find housing as he unable to return to Group Home, LCSW weekday SW to follow up to see how he is progressing  [facility] Coord[facility] Izola PriceHubert [name] [phone] Called Julian left detailed message expecting a call back  Completed assessment Julian Fl2.started Consulted with TTS Julian [facility].  History of Patient: Unable to return to Group Home/ Has a legal guardian/Cardinal Care Worker: Julian Fessndrew C Sprankle [name] is a 61 y.o. male with a past medical history of diabetes, schizophrenia, presents from his group home complaining of dental pain. However when we discussed the patient with a group home they state the patient has been increasingly psychotic saying that [place] is going to [place] he has been contacted by [facility]. [employer] in the joint [employer].   Delta Air LinesClaudine Linus Weckerly LCSW [phone]

## 2016-09-17 NOTE — ED Notes (Signed)
Pt awake and asking to take a taxi back to his group home. Explained to patient that he had to speak to psych MD and pt states he wants to sign out AMA. Further explained to patient that he had been placed under IVC. Pt asking to see the legal papers. Copy made and shown to the patient. Pt returned to bed.

## 2016-09-17 NOTE — ED Notes (Signed)
BEHAVIORAL HEALTH ROUNDING  Patient sleeping: No.  Patient alert and oriented: yes  Behavior appropriate: Yes. ; If no, describe:  Nutrition and fluids offered: Yes  Toileting and hygiene offered: Yes  Sitter present: not applicable, Q 15 min safety rounds and observation.  Law enforcement present: Yes ODS  

## 2016-09-17 NOTE — Progress Notes (Signed)
TTS [facility] with [facility].  She is requesting an EKG and Urinalysis for admission criteria.  TTS made request to ED physician Dr. Derrill KayGoodman

## 2016-09-17 NOTE — ED Notes (Signed)
[facility] perform [facility] on pt. As I walked into room and explained why and what I was there to do the pt said "I refuse, I do not want that electrical fuse on my heart." I told the pt that there was no electrical anything on the heart through the EKG. Pt said "my heart is fine I refuse". I told the RN on staff and she went and tried to explain to the pt the need for the EKG for placement. He still refused.  LM EDT

## 2016-09-17 NOTE — ED Notes (Signed)
[facility] by Technical sales engineerofficer and Psychologist, sport and exercisenurse tech

## 2016-09-17 NOTE — ED Notes (Signed)
Pt ambulatory to restroom

## 2016-09-17 NOTE — ED Notes (Signed)
Pt resting in bed watching TV.  No noted distress or abnormal behaviors noted. Will continue 15 minute checks and observation by security camera for safety.

## 2016-09-17 NOTE — ED Notes (Signed)
Pt sleeping; no distress noted

## 2016-09-17 NOTE — ED Notes (Signed)
BEHAVIORAL HEALTH ROUNDING Patient sleeping: Yes.   Patient alert and oriented: not applicable SLEEPING Behavior appropriate: Yes.  ; If no, describe: SLEEPING Nutrition and fluids offered: No SLEEPING Toileting and hygiene offered: NoSLEEPING Sitter present: not applicable, Q 15 min safety rounds and observation. Law enforcement present: Yes ODS 

## 2016-09-17 NOTE — ED Notes (Addendum)
Pt wanting to speak with his mother. RN attempted numerous times, but each number went to voicemail.   [phone]  / cell number [phone]

## 2016-09-17 NOTE — ED Notes (Signed)
Report was received from Amy B., RN; Pt. Verbalizes no complaints or distress; denies S.I./Hi. Continue to monitor with 15 min. Monitoring. 

## 2016-09-17 NOTE — ED Provider Notes (Signed)
-----------------------------------------   6:19 AM on 09/17/2016 -----------------------------------------   Blood pressure (!) 150/95, pulse 80, temperature 97.8 F (36.6 C), temperature source Oral, resp. rate 18, height 6' (1.829 m), weight 200 lb (90.7 kg), SpO2 99 %.  The patient had no acute events since last update.  Calm and cooperative at this time.  Disposition is pending Psychiatry/Behavioral Medicine team recommendations.     Merrily BrittleNeil Diasia Henken, MD 09/17/16 [phone]

## 2016-09-17 NOTE — ED Notes (Signed)
Patient asking for written information about the penicillin and informed the patient that I would ask the pharmacy to send him information. Pharmacy call and will send info.

## 2016-09-17 NOTE — BHH Counselor (Signed)
Referral information for Psychiatric Hospitalization faxed to:   [facility]([phone])     [phone])     [facility]([phone])     [phone])    [phone])    [phone])    [phone])    [facility] [phone])    [facility] [phone])
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
## 2016-09-17 NOTE — ED Notes (Signed)
Patient resting quietly in room. No noted distress or abnormal behaviors noted. Will continue 15 minute checks and observation by security camera for safety. 

## 2016-09-17 NOTE — NC FL2 (Signed)
[place] LEVEL OF CARE SCREENING TOOL     IDENTIFICATION  Patient Name: Julian [name]: [birth year] Sex: male Admission Date (Current Location): 09/16/2016  [facility] and [place] Number:  Engineering geologist and Address:  [facility], [address], [place], [place] [address]      Provider Number: [id number]  Attending Physician Name and Address:  No att. providers found  Relative Name and Phone Number:       Current Level of Care: Hospital Recommended Level of Care: [place] Prior Approval Number:    Date Approved/Denied:   PASRR Number:    Discharge Plan: Home    Current Diagnoses: Patient Active Problem List   Diagnosis Date Noted  . Schizoaffective disorder, bipolar type ([place]) 06/22/2015    Orientation RESPIRATION BLADDER Height & Weight     Self, Time, Situation, Place    Continent Weight: 200 lb (90.7 kg) Height:  6' (182.9 cm)  BEHAVIORAL SYMPTOMS/MOOD NEUROLOGICAL BOWEL NUTRITION STATUS      Continent Diet (Diabetic)  AMBULATORY STATUS COMMUNICATION OF NEEDS Skin   Independent Verbally Normal                       Personal Care Assistance Level of Assistance  Bathing, Feeding, Dressing, Total care Bathing Assistance: Independent Feeding assistance: Independent Dressing Assistance: Independent Total Care Assistance: Independent   Functional Limitations Info  Sight, Hearing, Speech Sight Info: Adequate Hearing Info: Adequate Speech Info: Adequate    SPECIAL CARE FACTORS FREQUENCY  Diabetic urine testing   Diabetic Urine Testing Frequency: Met formin                Contractures      Additional Factors Info  Code Status, Allergies Code Status Info: Full Allergies Info: ClozarilHaldolProloxin           Current Medications (09/17/2016):  This is the current hospital active medication list Current Facility-Administered Medications  Medication Dose Route Frequency  Provider Last Rate Last Dose  . acetaminophen (TYLENOL) tablet 650 mg  650 mg Oral Q6H PRN Harvest Dark, MD   650 mg at 09/17/16 0005  . benztropine (COGENTIN) tablet 0.5 mg  0.5 mg Oral BID Daymon Larsen, MD   0.5 mg at 09/17/16 1053  . diphenhydrAMINE (BENADRYL) capsule 50 mg  50 mg Oral BID Daymon Larsen, MD   50 mg at 09/17/16 1053  . metFORMIN (GLUCOPHAGE) tablet 500 mg  500 mg Oral BID WC Daymon Larsen, MD   Stopped at 09/17/16 1059  . nicotine (NICODERM CQ - dosed in mg/24 hours) patch 21 mg  21 mg Transdermal Once Darel Hong, MD   21 mg at 09/17/16 1052  . OLANZapine (ZYPREXA) injection 5 mg  5 mg Intramuscular Q12H PRN Daymon Larsen, MD      . OLANZapine [facility]) tablet 10 mg  10 mg Oral BID Daymon Larsen, MD   10 mg at 09/17/16 1054  . penicillin v potassium (VEETID) tablet 500 mg  500 mg Oral Q6H Harvest Dark, MD   500 mg at 09/17/16 1132   Current Outpatient Prescriptions  Medication Sig Dispense Refill  . benztropine (COGENTIN) 0.5 MG tablet Take 1 tablet (0.5 mg total) by mouth 2 (two) times daily. 60 tablet 0  . diphenhydrAMINE (BENADRYL) 50 MG tablet Take 50 mg by mouth 2 (two) times daily.    Marland Kitchen levofloxacin (LEVAQUIN) 500 MG tablet Take 500 mg by mouth  daily. For 5 days    . metFORMIN (GLUCOPHAGE) 500 MG tablet Take 500 mg by mouth 2 (two) times daily.    Marland Kitchen OLANZapine (ZYPREXA) 10 MG tablet Take 10 mg by mouth 2 (two) times daily.       Discharge Medications: Please see discharge summary for a list of discharge medications.  Relevant Imaging Results:  Relevant Lab Results:   Additional Information SSN # [id number]  Joana Reamer, [place]

## 2016-09-17 NOTE — ED Notes (Signed)
Pt lying in bed calm, no distress noted at present

## 2016-09-17 NOTE — ED Notes (Addendum)
Pt resting no distress noted.

## 2016-09-17 NOTE — ED Notes (Signed)
[facility] given [facility] and camera set up in room. Pt instructed about process of assessment via computer and pt is understanding.

## 2016-09-18 DIAGNOSIS — F250 Schizoaffective disorder, bipolar type: Secondary | ICD-10-CM | POA: Diagnosis not present

## 2016-09-18 DIAGNOSIS — F29 Unspecified psychosis not due to a substance or known physiological condition: Secondary | ICD-10-CM | POA: Diagnosis not present

## 2016-09-18 DIAGNOSIS — K047 Periapical abscess without sinus: Secondary | ICD-10-CM

## 2016-09-18 MED ORDER — NICOTINE POLACRILEX 2 MG MT GUM
2.0000 mg | CHEWING_GUM | OROMUCOSAL | Status: DC | PRN
  Administered 2016-09-18 – 2016-09-19 (×3): 2 mg via ORAL
  Filled 2016-09-18 (×2): qty 1

## 2016-09-18 MED ORDER — NICOTINE POLACRILEX 2 MG MT GUM
CHEWING_GUM | OROMUCOSAL | Status: AC
  Filled 2016-09-18: qty 1

## 2016-09-18 MED ORDER — LORAZEPAM 1 MG PO TABS
1.0000 mg | ORAL_TABLET | Freq: Once | ORAL | Status: AC
  Administered 2016-09-18: 1 mg via ORAL
  Filled 2016-09-18: qty 1

## 2016-09-18 MED ORDER — NICOTINE POLACRILEX 2 MG MT GUM: 2.0000 mg | CHEWING_GUM | OROMUCOSAL | Status: DC | PRN

## 2016-09-18 NOTE — ED Notes (Signed)
Pt approached nurses station asking to call his mother. RN explained phone hours began at 0900. Pt accepting and returned to room to lay down. Maintained on 15 minute checks and observation by security camera for safety.

## 2016-09-18 NOTE — ED Provider Notes (Signed)
Clinical Course as of Sep 18 1837  Mon Sep 18, 2016  1550 Dr. Toni Amendlapacs spoke with me in person to let me know the patient has been placed and will be transferred to his new facility today.  Awaiting further disposition details and transportation plans.  [CF]    Clinical Course User Index [CF] Loleta Roseory Berda Shelvin, MD      Loleta Roseory Lynnette Pote, MD 09/18/16 [phone]

## 2016-09-18 NOTE — ED Notes (Signed)
Report given to  Yolande JollyGrace Faircloth RN at  [facility], transport has been called . Pt is aware he is going and is agreeable to the plan.

## 2016-09-18 NOTE — ED Notes (Signed)
PAM  CALLED  FROM  911  WILL  NOT  HAVE ANYONE  TONIGHT  TO  TRANSPORT  PT  INFORMED  RUTHIE  RN  ALSO  [place]  SEC

## 2016-09-18 NOTE — ED Notes (Signed)
Patient received PM snack. 
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
## 2016-09-18 NOTE — ED Notes (Signed)
Called nurses at [facility] to notify that they can not transport to early in the morning
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
## 2016-09-18 NOTE — ED Notes (Signed)
Pt asking to call Pam at [employer]. RN spoke with Elita QuickPam and she does not know [name]. Her phone number is the main company line and [name] has called her many times. Ms. Elita Quickam was very nice, but does not wish to speak with the patient. [phone] is the  number.  Please do not dial it for him.
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
## 2016-09-18 NOTE — ED Notes (Signed)
PT  IVC  CALLED  SHERIFF DEPT  TO  TRANSPORT  PT  [facility]  IF THEY  CAN  TONIGHT  WILL  CALL  BACK  INFORMED  RN  RUTHIE  [facility]  [facility]
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
## 2016-09-18 NOTE — ED Provider Notes (Signed)
-----------------------------------------   7:14 AM on 09/18/2016 -----------------------------------------   Blood pressure 121/69, pulse 83, temperature 98.4 F (36.9 C), temperature source Oral, resp. rate 18, height 6' (1.829 m), weight 200 lb (90.7 kg), SpO2 97 %.  The patient had no acute events since last update.  Calm and cooperative at this time.  Disposition is pending Psychiatry/Behavioral Medicine team recommendations.     Irean HongJade J Sung, MD 09/18/16 [phone]
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
## 2016-09-19 DIAGNOSIS — F29 Unspecified psychosis not due to a substance or known physiological condition: Secondary | ICD-10-CM | POA: Diagnosis not present

## 2016-09-19 NOTE — ED Notes (Signed)
[facility].

## 2016-09-19 NOTE — ED Notes (Signed)
Patient calm and cooperative throughout the shift.  Patient still displays a fixed delusion of employment with [employer].  He reports that his employment was arranged by Dr Jennet MaduroPucilowska.

## 2016-09-19 NOTE — ED Notes (Signed)
Patient got dressed, police escort here to transport [place] [facility], patient took all am medications, all of his belongings given to Police officer, patient was compliant and was appropriate , v/s stable.

## 2016-09-19 NOTE — ED Notes (Signed)
Nurse took patient breakfast and He was pleasant, no signs of distress, denies Si/hi ,nurse let patient know that [facility]. Patient with q 15 minute checks and camera surveillance in progress.

## 2016-09-19 NOTE — ED Notes (Signed)
Called His mom to let her know that He was being [facility].
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
## 2017-01-20 IMAGING — CR DG CHEST 2V
2 series · 2 of 2 positions shown · non-contrast
Comparison: None.

CLINICAL DATA: Patient called 911 complaining that his lithium
levels were not adjusted correctly. Medical clearance.

EXAM:
CHEST  2 VIEW

[w chest pa]
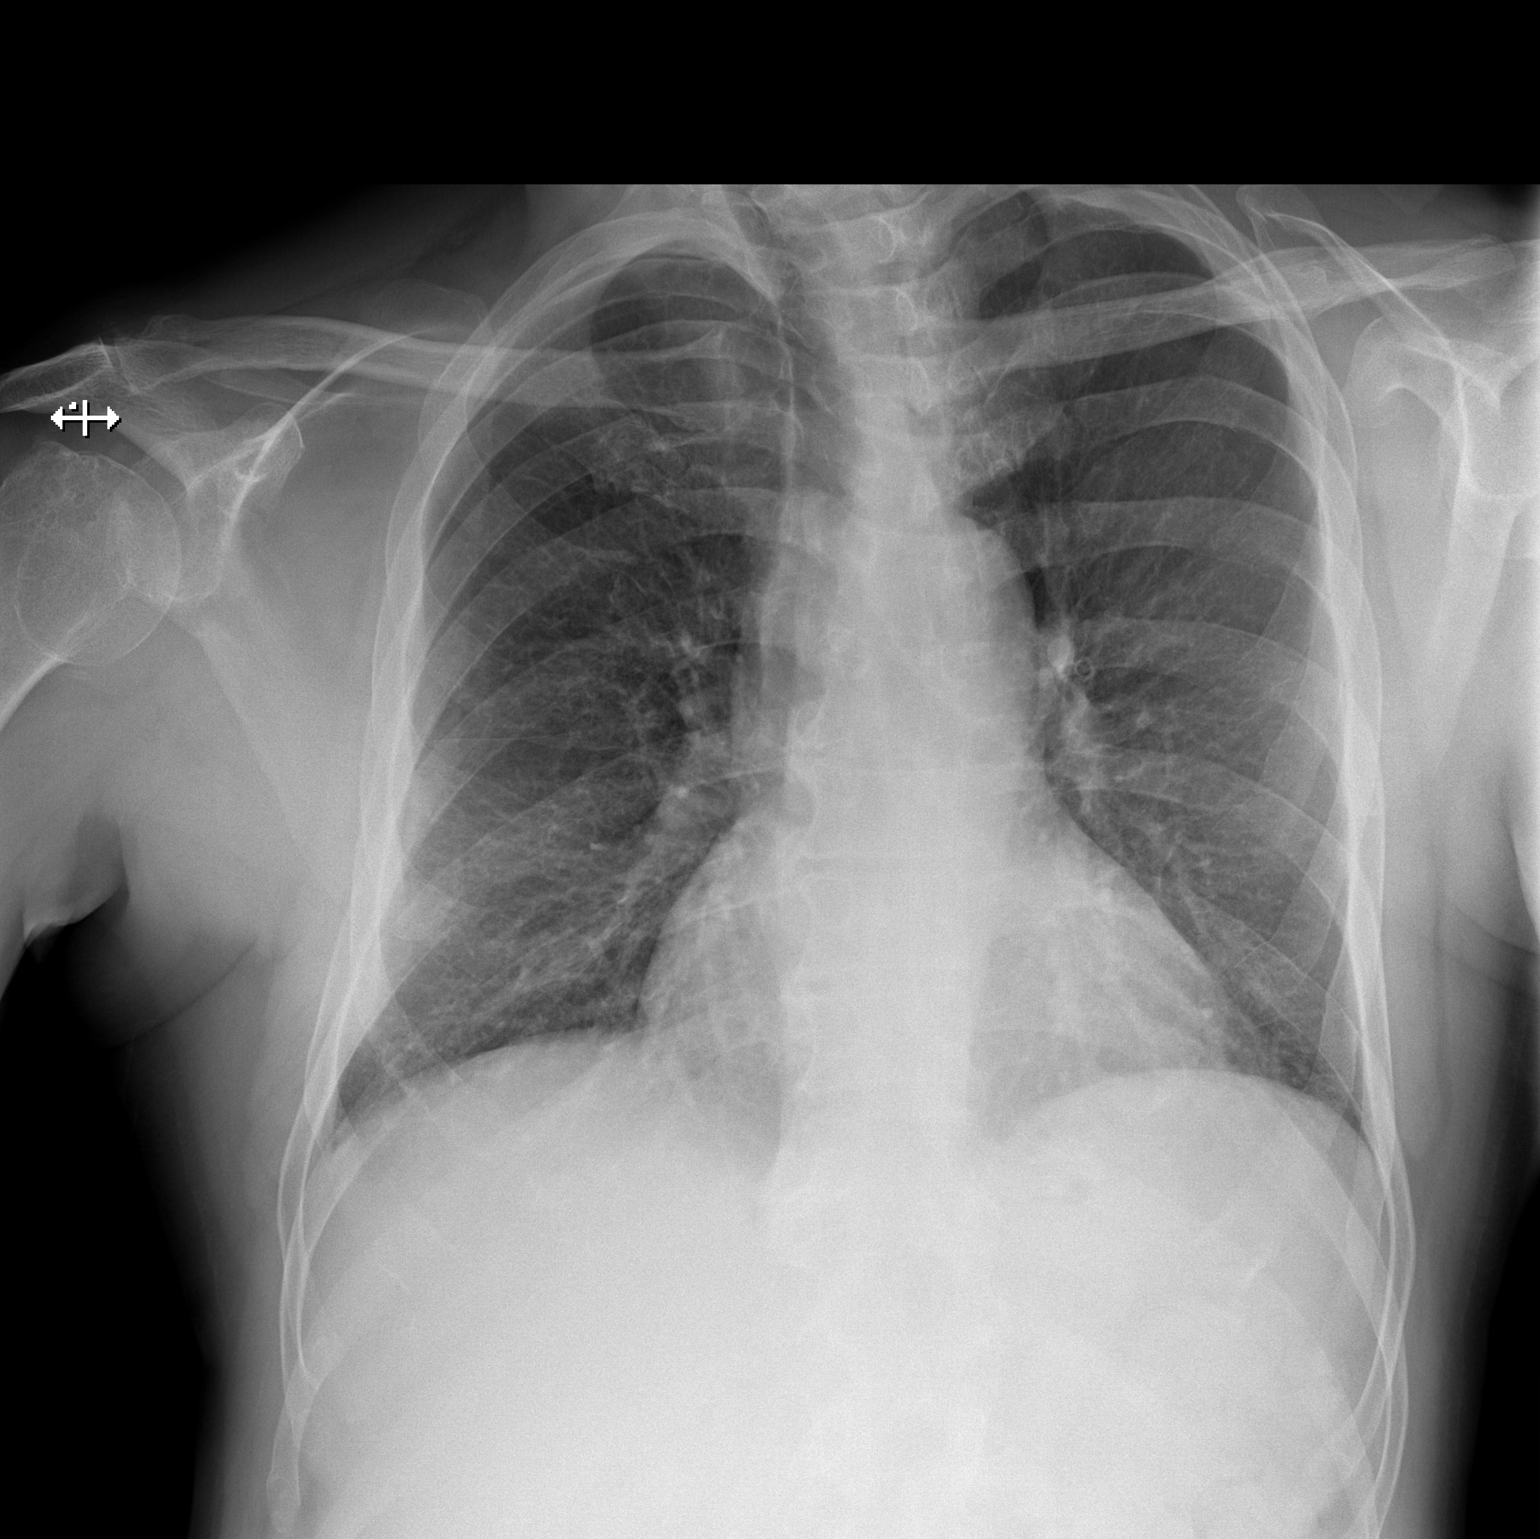

[w chest lat]
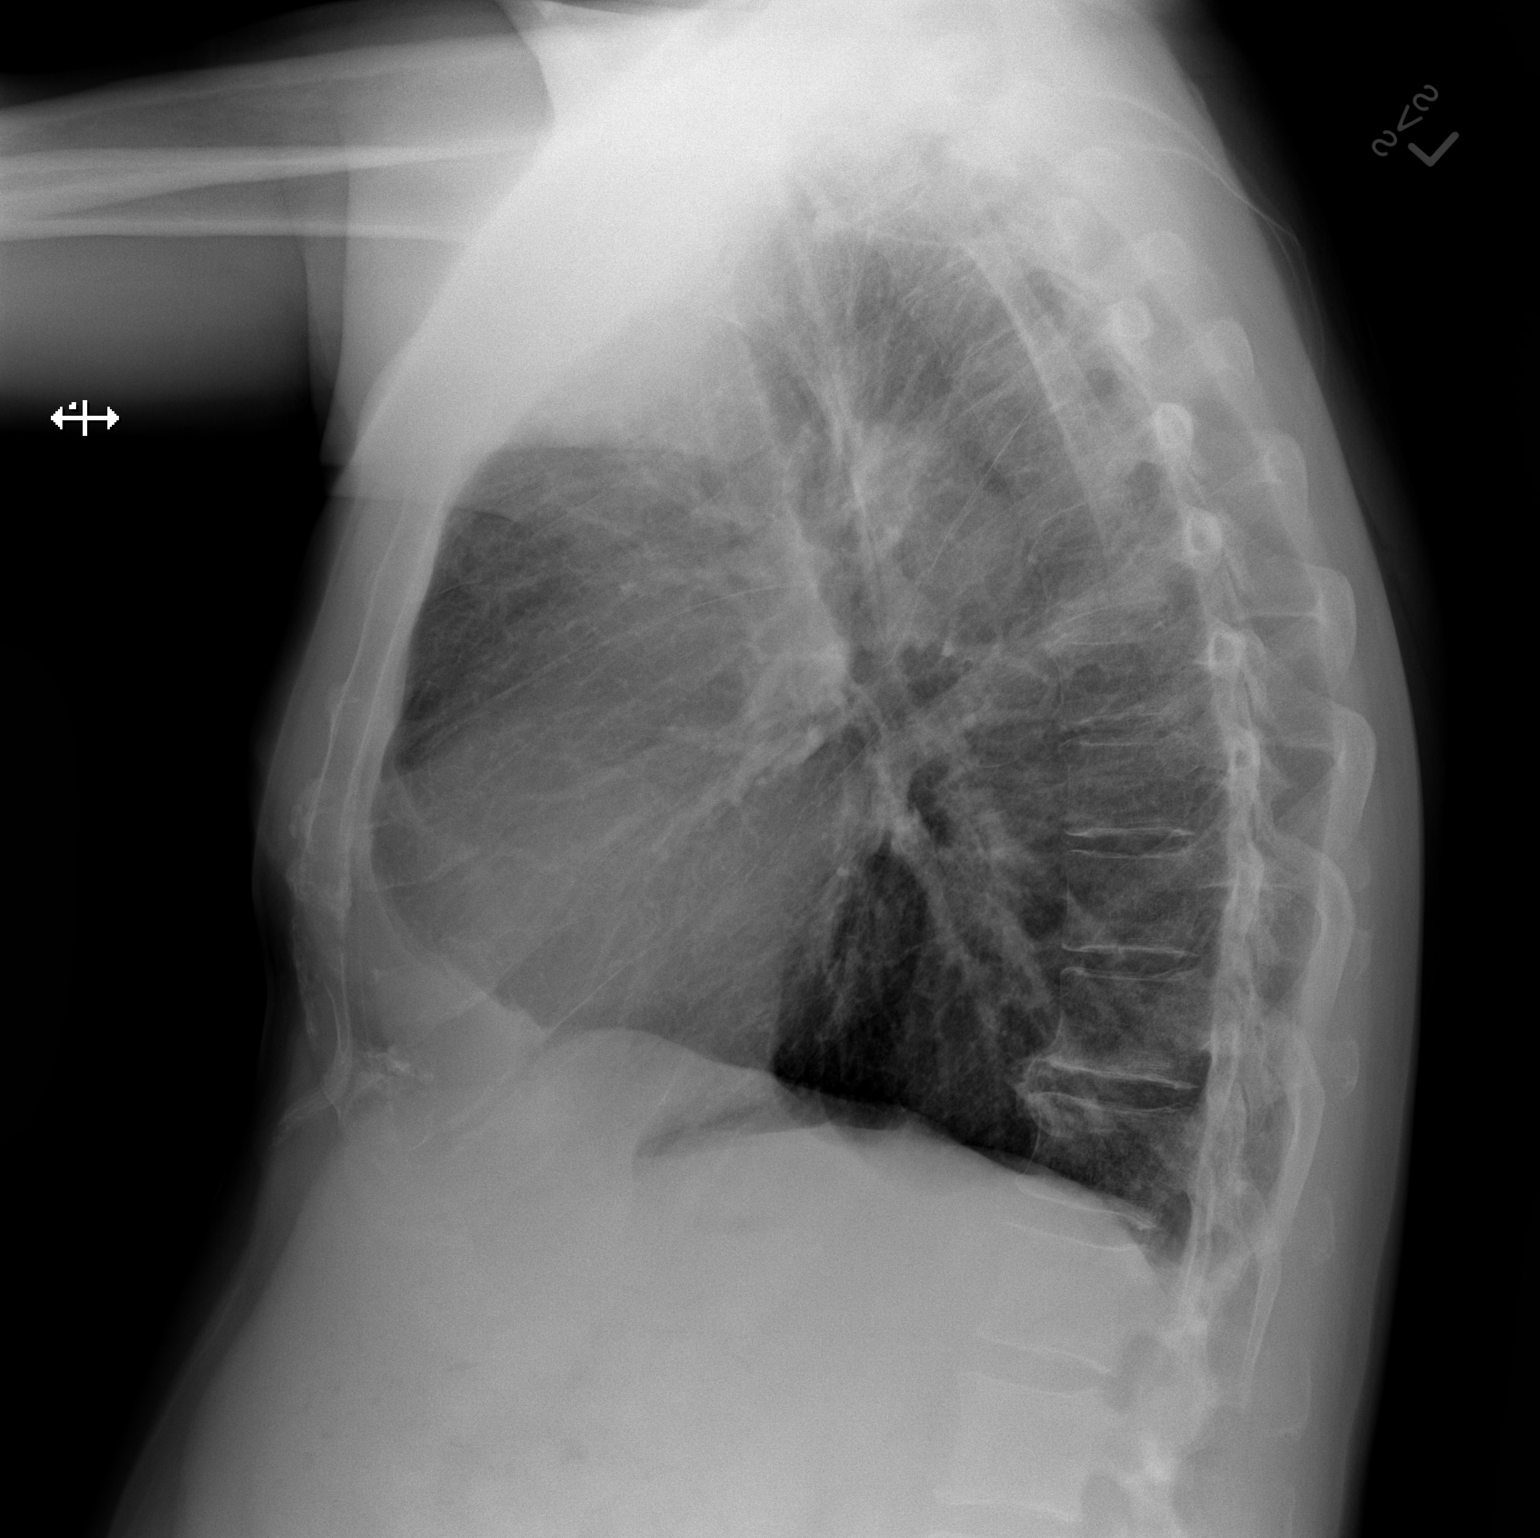

[2 of 2 positions shown; findings below may reference images not displayed]

FINDINGS: Cardiac silhouette normal in size. No mediastinal or hilar masses or
evidence of adenopathy.

Clear lungs.  No pleural effusion or pneumothorax.

Bony thorax is demineralized but grossly intact.
IMPRESSION: No acute cardiopulmonary disease.
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
## 2018-01-17 ENCOUNTER — Emergency Department
Admission: EM | Admit: 2018-01-17 | Discharge: 2018-01-17 | Disposition: A | Discharge status: Other Acute Inpatient Hospital | Payer: Medicare Other
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
## 2018-01-17 NOTE — ED Notes (Signed)
[employer] checked on pt and he is eating in cafeteria and in NAD

## 2018-01-17 NOTE — ED Notes (Signed)
Group home owner present. Pt's legal guardian [facility] not [facility] at this facility. Legal guardian knows pt has not been triaged and states that pt should not seen here and wants pt to leave with group home representative and continue [facility]. Pt leaves with group home.

## 2018-01-17 NOTE — ED Notes (Signed)
Pt returned from cafeteria to report he is not ready for triage at this time because he needs "1 more cigarette" Pt is ambulatory and in NAD and reporting, "I will not leave I promise. I did not come all this way to leave."

## 2018-06-03 ENCOUNTER — Emergency Department
Admission: EM | Admit: 2018-06-03 | Discharge: 2018-06-05 | Disposition: A | Discharge status: Home / Self Care | Payer: Medicare Other | Attending: Emergency Medicine | Admitting: Emergency Medicine

## 2018-06-03 ENCOUNTER — Encounter: Payer: Self-pay | Admitting: Emergency Medicine

## 2018-06-03 ENCOUNTER — Other Ambulatory Visit: Payer: Self-pay

## 2018-06-03 DIAGNOSIS — E876 Hypokalemia: Secondary | ICD-10-CM | POA: Insufficient documentation

## 2018-06-03 DIAGNOSIS — E119 Type 2 diabetes mellitus without complications: Secondary | ICD-10-CM | POA: Diagnosis not present

## 2018-06-03 DIAGNOSIS — Z7984 Long term (current) use of oral hypoglycemic drugs: Secondary | ICD-10-CM | POA: Insufficient documentation

## 2018-06-03 DIAGNOSIS — F17210 Nicotine dependence, cigarettes, uncomplicated: Secondary | ICD-10-CM | POA: Insufficient documentation

## 2018-06-03 DIAGNOSIS — F250 Schizoaffective disorder, bipolar type: Secondary | ICD-10-CM | POA: Diagnosis present

## 2018-06-03 DIAGNOSIS — F209 Schizophrenia, unspecified: Secondary | ICD-10-CM | POA: Insufficient documentation

## 2018-06-03 DIAGNOSIS — Z79899 Other long term (current) drug therapy: Secondary | ICD-10-CM | POA: Insufficient documentation

## 2018-06-03 DIAGNOSIS — Z046 Encounter for general psychiatric examination, requested by authority: Secondary | ICD-10-CM | POA: Diagnosis present

## 2018-06-03 NOTE — ED Triage Notes (Signed)
Pt from group home, [facility]. They report he has been refusing meds, was buying pain medication he is not supposed to have. They found patient in road one day.  Patient asked to remove coat for BP and he mumbled something and they said "amen".  Pt also states "I have a phd in physics".  Walked back to 22 to triage and obtain vital signs.  Patient not cooperative.

## 2018-06-03 NOTE — ED Notes (Addendum)
Patient still refusing to dress out. Will not comply till he speaks to the Doctor.

## 2018-06-03 NOTE — ED Provider Notes (Signed)
[facility] Emergency Department Provider Note   ____________________________________________   First MD Initiated Contact with Patient 06/03/18 2348     (approximate)  I have reviewed the triage vital signs and the nursing notes.   HISTORY  Chief Complaint Psychiatric Evaluation    HPI Julian [name] is a 62 y.o. male brought to the ED from group home for refusing medications and bizarre behavior.  Patient has a history of schizophrenia and was brought to the ED by a staff member.  Reportedly he left the ED and was found by police wandering the facilities.  Staff member also states patient is buying pain medicine which he is not supposed to be taking.  Patient denies SI/HI.  Mumbling ranting's about religion, "amen".  Informed triage nurse he has a "PhD in physics".  Voices no medical complaints.   Past Medical History:  Diagnosis Date  . Diabetes mellitus without complication (HCC)   . Schizophrenia [facility])     Patient Active Problem List   Diagnosis Date Noted  . Dental infection 09/18/2016  . Schizoaffective disorder, bipolar type (HCC) 06/22/2015    History reviewed. No pertinent surgical history.  Prior to Admission medications   Medication Sig Start Date End Date Taking? Authorizing Provider  benztropine (COGENTIN) 0.5 MG tablet Take 1 tablet (0.5 mg total) by mouth 2 (two) times daily. 06/21/15   Jimmy Footman, MD  diphenhydrAMINE (BENADRYL) 50 MG tablet Take 50 mg by mouth 2 (two) times daily.    [provider]  levofloxacin (LEVAQUIN) 500 MG tablet Take 500 mg by mouth daily. For 5 days    [provider]  metFORMIN (GLUCOPHAGE) 500 MG tablet Take 500 mg by mouth 2 (two) times daily.    [provider]  OLANZapine (ZYPREXA) 10 MG tablet Take 10 mg by mouth 2 (two) times daily.    [provider]    Allergies Clozaril [clozapine]; Haldol [haloperidol lactate]; and Prolixin  [fluphenazine]  History reviewed. No pertinent family history.  Social History Social History   Tobacco Use  . Smoking status: Current Every Day Smoker    Packs/day: 1.00    Types: Cigarettes  . Smokeless tobacco: Never Used  Substance Use Topics  . Alcohol use: No  . Drug use: No    Review of Systems  Constitutional: No fever/chills Eyes: No visual changes. ENT: No sore throat. Cardiovascular: Denies chest pain. Respiratory: Denies shortness of breath. Gastrointestinal: No abdominal pain.  No nausea, no vomiting.  No diarrhea.  No constipation. Genitourinary: Negative for dysuria. Musculoskeletal: Negative for back pain. Skin: Negative for rash. Neurological: Negative for headaches, focal weakness or numbness. Psychiatric:Positive for bizarre behavior.  ____________________________________________   PHYSICAL EXAM:  VITAL SIGNS: ED Triage Vitals  Enc Vitals Group     BP      Pulse      Resp      Temp      Temp src      SpO2      Weight      Height      Head Circumference      Peak Flow      Pain Score      Pain Loc      Pain Edu?      Excl. in GC?     Constitutional: Alert and oriented.  Dishoveled appearing and in no acute distress. Eyes: Conjunctivae are normal. PERRL. EOMI. Head: Atraumatic. Nose: No congestion/rhinnorhea. Mouth/Throat: Mucous membranes are moist.  Oropharynx non-erythematous. Neck: No stridor.   Cardiovascular: Normal rate, regular rhythm. Grossly normal heart sounds.  Good peripheral circulation. Respiratory: Normal respiratory effort.  No retractions. Lungs CTAB. Gastrointestinal: Soft and nontender. No distention. No abdominal bruits. No CVA tenderness. Musculoskeletal: No lower extremity tenderness nor edema.  No joint effusions. Neurologic:  Normal speech and language. No gross focal neurologic deficits are appreciated. No gait instability. Skin:  Skin is warm, dry and intact. No rash noted. Psychiatric: Mood and affect are  bizarre. Speech and behavior are normal.  Tangential thoughts.  ____________________________________________   LABS (all labs ordered are listed, but only abnormal results are displayed)  Labs Reviewed  CBC WITH DIFFERENTIAL/PLATELET - Abnormal; Notable for the following components:      Result Value   RBC 3.50 (*)    Hemoglobin 11.1 (*)    HCT 32.6 (*)    All other components within normal limits  COMPREHENSIVE METABOLIC PANEL - Abnormal; Notable for the following components:   Potassium 3.0 (*)    Glucose, Bld 176 (*)    All other components within normal limits  ACETAMINOPHEN LEVEL - Abnormal; Notable for the following components:   Acetaminophen (Tylenol), Serum <10 (*)    All other components within normal limits  ETHANOL  SALICYLATE LEVEL  TROPONIN I  URINE DRUG SCREEN, QUALITATIVE ([facility] ONLY)  URINALYSIS, COMPLETE (UACMP) WITH MICROSCOPIC   ____________________________________________  EKG  Patient refused; states he does not want the electrodes to "poison my heart" ____________________________________________  RADIOLOGY  ED MD interpretation: None  Official radiology report(s): No results found.  ____________________________________________   PROCEDURES  Procedure(s) performed: None  Procedures  Critical Care performed: No  ____________________________________________   INITIAL IMPRESSION / ASSESSMENT AND PLAN / ED COURSE  As part of my medical decision making, I reviewed the following data within the electronic MEDICAL RECORD NUMBER Nursing notes reviewed and incorporated, Labs reviewed, Old chart reviewed, A consult was requested and obtained from this/these consultant(s) Psychiatry and Notes from prior ED visits   62 year old male with schizophrenia brought from the group home for refusing medications and bizarre behavior.  Patient is more cooperative after my interview and examination.  Agreeable to lab work and urinalysis.  Refuses EKG because he  does not want the electrodes to "poison my heart".  Since patient is cooperative for the time being, will hold off IM calming agent unless his behavior escalates.  Given his schizophrenia and bizarre behavior concerning for psychosis as he is off his medications, will place him under IVC for both his and staff safety.  Consult psychiatry and TTS.  Clinical Course as of Jun 04 657  Tue Jun 04, 2018  [id number] Patient sleeping no acute distress.  Awaiting tele-psychiatry evaluation.  Remains in the ED under IVC pending psychiatric disposition.   [JS]    Clinical Course User Index [JS] Irean HongSung, Brittlyn Cloe J, MD     ____________________________________________   FINAL CLINICAL IMPRESSION(S) / ED DIAGNOSES  Final diagnoses:  Schizophrenia, unspecified type (HCC)  Hypokalemia     ED Discharge Orders    None       Note:  This document was prepared using Dragon voice recognition software and may include unintentional dictation errors.    Irean HongSung, Nilton Lave J, MD 06/04/18 [phone]

## 2018-06-04 DIAGNOSIS — F250 Schizoaffective disorder, bipolar type: Secondary | ICD-10-CM

## 2018-06-04 DIAGNOSIS — E119 Type 2 diabetes mellitus without complications: Secondary | ICD-10-CM

## 2018-06-04 LAB — URINALYSIS, COMPLETE (UACMP) WITH MICROSCOPIC
Bacteria, UA: NONE SEEN
Bilirubin Urine: NEGATIVE
GLUCOSE, UA: NEGATIVE mg/dL
HGB URINE DIPSTICK: NEGATIVE
KETONES UR: NEGATIVE mg/dL
LEUKOCYTES UA: NEGATIVE
NITRITE: NEGATIVE
PH: 6 (ref 5.0–8.0)
PROTEIN: NEGATIVE mg/dL
Specific Gravity, Urine: 1.008 (ref 1.005–1.030)
Squamous Epithelial / LPF: NONE SEEN (ref 0–5)

## 2018-06-04 LAB — CBC WITH DIFFERENTIAL/PLATELET
ABS IMMATURE GRANULOCYTES: 0.03 10*3/uL (ref 0.00–0.07)
BASOS ABS: 0 10*3/uL (ref 0.0–0.1)
BASOS PCT: 0 %
Eosinophils Absolute: 0.1 10*3/uL (ref 0.0–0.5)
Eosinophils Relative: 1 %
HCT: 32.6 % — ABNORMAL LOW (ref 39.0–52.0)
HEMOGLOBIN: 11.1 g/dL — AB (ref 13.0–17.0)
Immature Granulocytes: 0 %
LYMPHS PCT: 20 %
Lymphs Abs: 1.6 10*3/uL (ref 0.7–4.0)
MCH: 31.7 pg (ref 26.0–34.0)
MCHC: 34 g/dL (ref 30.0–36.0)
MCV: 93.1 fL (ref 80.0–100.0)
Monocytes Absolute: 0.7 10*3/uL (ref 0.1–1.0)
Monocytes Relative: 9 %
NEUTROS ABS: 5.4 10*3/uL (ref 1.7–7.7)
NRBC: 0 % (ref 0.0–0.2)
Neutrophils Relative %: 70 %
PLATELETS: 334 10*3/uL (ref 150–400)
RBC: 3.5 MIL/uL — AB (ref 4.22–5.81)
RDW: 11.9 % (ref 11.5–15.5)
WBC: 7.8 10*3/uL (ref 4.0–10.5)

## 2018-06-04 LAB — SALICYLATE LEVEL: Salicylate Lvl: <7 mg/dL (ref 2.8–30.0)

## 2018-06-04 LAB — COMPREHENSIVE METABOLIC PANEL
ALBUMIN: 3.9 g/dL (ref 3.5–5.0)
ALK PHOS: 63 U/L (ref 38–126)
ALT: 17 U/L (ref 0–44)
ANION GAP: 11 (ref 5–15)
AST: 25 U/L (ref 15–41)
BUN: 11 mg/dL (ref 8–23)
CALCIUM: 9.1 mg/dL (ref 8.9–10.3)
CHLORIDE: 101 mmol/L (ref 98–111)
CO2: 27 mmol/L (ref 22–32)
Creatinine, Ser: 0.64 mg/dL (ref 0.61–1.24)
GFR calc Af Amer: >60 mL/min (ref >60)
GFR calc non Af Amer: >60 mL/min (ref >60)
GLUCOSE: 176 mg/dL — AB (ref 70–99)
POTASSIUM: 3 mmol/L — AB (ref 3.5–5.1)
SODIUM: 139 mmol/L (ref 135–145)
Total Bilirubin: 0.5 mg/dL (ref 0.3–1.2)
Total Protein: 7.2 g/dL (ref 6.5–8.1)

## 2018-06-04 LAB — ETHANOL: Alcohol, Ethyl (B): <10 mg/dL (ref <10)

## 2018-06-04 LAB — URINE DRUG SCREEN, QUALITATIVE (ARMC ONLY)
AMPHETAMINES, UR SCREEN: NOT DETECTED
BENZODIAZEPINE, UR SCRN: NOT DETECTED
Barbiturates, Ur Screen: NOT DETECTED
Cannabinoid 50 Ng, Ur ~~LOC~~: NOT DETECTED
Cocaine Metabolite,Ur ~~LOC~~: NOT DETECTED
MDMA (Ecstasy)Ur Screen: NOT DETECTED
METHADONE SCREEN, URINE: NOT DETECTED
Opiate, Ur Screen: NOT DETECTED
Phencyclidine (PCP) Ur S: NOT DETECTED
TRICYCLIC, UR SCREEN: POSITIVE — AB

## 2018-06-04 LAB — ACETAMINOPHEN LEVEL

## 2018-06-04 LAB — TROPONIN I: Troponin I: <0.03 ng/mL (ref <0.03)

## 2018-06-04 MED ORDER — POTASSIUM CHLORIDE CRYS ER 20 MEQ PO TBCR
40.0000 meq | EXTENDED_RELEASE_TABLET | Freq: Once | ORAL | Status: AC
  Administered 2018-06-04: 40 meq via ORAL
  Filled 2018-06-04: qty 2
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
## 2018-06-04 NOTE — ED Notes (Signed)
Pt. Transferred from Triage to room 22 refusing to dress out. Report to include Situation, Background, Assessment and Recommendations from [facility]. Patient is alert, warm and dry in no acute distress. Patient denies SI, HI, and AVH. Pt. Encouraged to let me know if needs arise.

## 2018-06-04 NOTE — BH Assessment (Signed)
Assessment Note  Julian [name] is an 62 y.o. male. TTS attempted to conduct assessment. Julian [name] stared at the assessor and refused to speak.  DGeneral Assessment Data Assessment unable to be completed: Yes Reason for not completing assessment: Patient refused to speak with the TTS assessor                                                 Advance Directives (For Healthcare) Does Patient Have a Medical Advance Directive?: No          Disposition:     On Site Evaluation by:   Reviewed with Physician:    Justice DeedsKeisha Anh Mangano 06/04/2018 1:08 AM

## 2018-06-04 NOTE — ED Notes (Signed)
Hourly rounding reveals patient sleeping in room. No complaints, stable, in no acute distress. Q15 minute rounds and monitoring via Rover and Officer to continue.  

## 2018-06-04 NOTE — ED Notes (Signed)
RN still unable to contact group home (unable to leave voicemail).

## 2018-06-04 NOTE — ED Notes (Signed)
Hourly rounding reveals patient sleeping in room. No complaints, stable, in no acute distress. Q15 minute rounds and monitoring via Security Cameras to continue. 

## 2018-06-04 NOTE — ED Notes (Addendum)
Pt belongings: 1 pr black boots, 1 pr black socks, 1 pr jeans, 1 blue tshirt, 1 pr briefs, 1 cellphone, 1 black coat, 1 brown cap, 1 watch.

## 2018-06-04 NOTE — ED Notes (Signed)
Pt given lunch tray.

## 2018-06-04 NOTE — ED Notes (Addendum)
Patient still sleeping and unwilling to change out to scrubs or allow EKG.

## 2018-06-04 NOTE — ED Notes (Signed)
[facility]
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
## 2018-06-04 NOTE — ED Notes (Signed)
RN spoke with Mr. Julian [name] (legal guardian) and was given verbal permission to send the patient home in a cab.   Address: [address], [place]  Do NOT send patient's cell phone home with him. Mr. Julian [name] will come pick it up.

## 2018-06-04 NOTE — ED Notes (Signed)
Patient refusing to undress for EKG etc.

## 2018-06-04 NOTE — ED Notes (Signed)
RN still unable to contact group home. Unable to leave a voicemail.

## 2018-06-04 NOTE — ED Notes (Signed)
Dr. Dolores FrameSung made aware of patients request to see her before agreeing to dress out etc. Patient does say "I'm not suicidal or homicidal".

## 2018-06-04 NOTE — ED Notes (Addendum)
[facility] [phone])  Staff unable to provide transportation for patient, but stated the legal guardian had spoken to hospital staff about sending patient home in a cab.  Neither TTS or this Clinical research associatewriter have heard this from guardian.   RN left voicemail for legal guardian requesting a call back.

## 2018-06-04 NOTE — ED Notes (Addendum)
Patient resting quietly in room. No noted distress or abnormal behaviors noted. Will continue 15 minute checks and observation by security camera for safety. 

## 2018-06-04 NOTE — Consult Note (Signed)
[facility]   Reason for Consult: Consult for 62 year old man with schizophrenia brought from his group home  Referring Physician: Cyril LoosenKinner Patient Identification: Julian Fessndrew C Lope [name] MRN:  [id number] Principal Diagnosis: Schizoaffective disorder, bipolar type (HCC) Diagnosis:  Principal Problem:   Schizoaffective disorder, bipolar type (HCC) Active Problems:   Diabetes (HCC)   Total Time spent with patient: 1 hour  Subjective:   Julian [name] is a 62 y.o. male patient admitted with "I had a hard time getting direct deposit".  HPI: Patient brought here from his group home last night with reports that he had been aggressive disorganized acting strangely and possibly refusing medicine.  Patient was noncompliant with interview last night but this morning he is willing to talk with me.  He tells me that his only concern is wanting to get direct deposit of his check.  He is a little disorganized in his speech and thinking.  Still having delusions.  Tells me that his plan long-term is to found a new University in order to make money.  He denies any hallucinations however and denies suicidal or homicidal ideation.  He says he is perfectly happy [facility].  Patient just got out of a 5247-month hospitalization a few days ago.  According to the patient he is only supposed to be taking Depakote and metformin because the antipsychotics were making his blood sugar 2 out of control.  Currently the patient is calm not aggressive not threatening appears to be at his baseline.  Denies that he is been drinking or using any drugs.  Medical history: Patient has diabetes.  Blood sugars here are in the high 100s.  Social history: Has a legal guardian from the Department of social services.  Lives in a group home.  Long-standing mental health problems.  Substance abuse history: Patient is vague about this but indicates that in the distant past he drank too much  but does not drink or use any drugs currently.  The group home had said that he was buying pain medicine off the street that he was not supposed to take.  Patient denies this and the drug screen is unremarkable.  Past Psychiatric History: Patient has a history of schizophrenia.  Has had hospitalizations in the past.  Has had some aggression and noncompliance.  Denies any past suicide attempts  Risk to Self: Suicidal Ideation: No Suicidal Intent: No Is patient at risk for suicide?: No Suicidal Plan?: No Access to Means: No What has been your use of drugs/alcohol within the last 12 months?: Reports of none How many times?: 0 Other Self Harm Risks: Reports of none Triggers for Past Attempts: None known Intentional Self Injurious Behavior: None Risk to Others: Homicidal Ideation: No Thoughts of Harm to Others: No Current Homicidal Intent: No Current Homicidal Plan: No Access to Homicidal Means: No Identified Victim: Reports of none History of harm to others?: No Assessment of Violence: None Noted Violent Behavior Description: Reports of none Does patient have access to weapons?: No Criminal Charges Pending?: No Does patient have a court date: No Prior Inpatient Therapy: Prior Inpatient Therapy: Yes Prior Therapy Dates: Multiple Hospitalizations  Prior Therapy Facilty/Provider(s): Multiple Hospitalizations  Reason for Treatment: Schizophrenia Prior Outpatient Therapy: Prior Outpatient Therapy: No Does patient have an ACCT team?: No Does patient have Intensive In-House Services?  : No Does patient have [facility] services? : No Does patient have P4CC services?: No  Past Medical History:  Past Medical  History:  Diagnosis Date  . Diabetes mellitus without complication (HCC)   . Schizophrenia (HCC)    History reviewed. No pertinent surgical history. Family History: History reviewed. No pertinent family history. Family Psychiatric  History: Does not know of any Social History:  Social  History   Substance and Sexual Activity  Alcohol Use No     Social History   Substance and Sexual Activity  Drug Use No    Social History   Socioeconomic History  . Marital status: Married    Spouse name: Not on file  . Number of children: Not on file  . Years of education: Not on file  . Highest education level: Not on file  Occupational History  . Not on file  Social Needs  . Financial resource strain: Not on file  . Food insecurity:    Worry: Not on file    Inability: Not on file  . Transportation needs:    Medical: Not on file    Non-medical: Not on file  Tobacco Use  . Smoking status: Current Every Day Smoker    Packs/day: 1.00    Types: Cigarettes  . Smokeless tobacco: Never Used  Substance and Sexual Activity  . Alcohol use: No  . Drug use: No  . Sexual activity: Never  Lifestyle  . Physical activity:    Days per week: Not on file    Minutes per session: Not on file  . Stress: Not on file  Relationships  . Social connections:    Talks on phone: Not on file    Gets together: Not on file    Attends religious service: Not on file    Active member of club or organization: Not on file    Attends meetings of clubs or organizations: Not on file    Relationship status: Not on file  Other Topics Concern  . Not on file  Social History Narrative  . Not on file   Additional Social History:    Allergies:   Allergies  Allergen Reactions  . Clozaril [Clozapine]     Seizure and cardiac arrest  . Haldol [Haloperidol Lactate]     seizures  . Prolixin [Fluphenazine]     seizures    Labs:  Results for orders placed or performed during the hospital encounter of 06/03/18 (from the past 48 hour(s))  CBC with Differential     Status: Abnormal   Collection Time: 06/04/18 12:25 AM  Result Value Ref Range   WBC 7.8 4.0 - 10.5 K/uL   RBC 3.50 (L) 4.22 - 5.81 MIL/uL   Hemoglobin 11.1 (L) 13.0 - 17.0 g/dL   HCT 16.1 (L) 09.6 - 04.5 %   MCV 93.1 80.0 - 100.0 fL    MCH 31.7 26.0 - 34.0 pg   MCHC 34.0 30.0 - 36.0 g/dL   RDW 40.9 81.1 - 91.4 %   Platelets 334 150 - 400 K/uL   nRBC 0.0 0.0 - 0.2 %   Neutrophils Relative % 70 %   Neutro Abs 5.4 1.7 - 7.7 K/uL   Lymphocytes Relative 20 %   Lymphs Abs 1.6 0.7 - 4.0 K/uL   Monocytes Relative 9 %   Monocytes Absolute 0.7 0.1 - 1.0 K/uL   Eosinophils Relative 1 %   Eosinophils Absolute 0.1 0.0 - 0.5 K/uL   Basophils Relative 0 %   Basophils Absolute 0.0 0.0 - 0.1 K/uL   Immature Granulocytes 0 %   Abs Immature Granulocytes 0.03 0.00 - 0.07 K/uL  Comment: Performed at [facility], [address] [place], [place] [address]  Comprehensive metabolic panel     Status: Abnormal   Collection Time: 06/04/18 12:25 AM  Result Value Ref Range   Sodium 139 135 - 145 mmol/L   Potassium 3.0 (L) 3.5 - 5.1 mmol/L   Chloride 101 98 - 111 mmol/L   CO2 27 22 - 32 mmol/L   Glucose, Bld 176 (H) 70 - 99 mg/dL   BUN 11 8 - 23 mg/dL   Creatinine, Ser 9.14 0.61 - 1.24 mg/dL   Calcium 9.1 8.9 - 78.2 mg/dL   Total Protein 7.2 6.5 - 8.1 g/dL   Albumin 3.9 3.5 - 5.0 g/dL   AST 25 15 - 41 U/L   ALT 17 0 - 44 U/L   Alkaline Phosphatase 63 38 - 126 U/L   Total Bilirubin 0.5 0.3 - 1.2 mg/dL   GFR calc non Af Amer >60 >60 mL/min   GFR calc Af Amer >60 >60 mL/min   Anion gap 11 5 - 15    Comment: Performed at [facility] [address] [place], [place] [address]  Ethanol     Status: None   Collection Time: 06/04/18 12:25 AM  Result Value Ref Range   Alcohol, Ethyl (B) <10 <10 mg/dL    Comment: (NOTE) Lowest detectable limit for serum alcohol is 10 mg/dL. For medical purposes only. Performed at [facility], [address] [place], [place] [address]   Acetaminophen level     Status: Abnormal   Collection Time: 06/04/18 12:25 AM  Result Value Ref Range   Acetaminophen (Tylenol), Serum <10 (L) 10 - 30 ug/mL    Comment: (NOTE) Therapeutic concentrations vary significantly. A range  of 10-30 ug/mL  may be an effective concentration for many patients. However, some  are best treated at concentrations outside of this range. Acetaminophen concentrations >150 ug/mL at 4 hours after ingestion  and >50 ug/mL at 12 hours after ingestion are often associated with  toxic reactions. Performed at [facility], [address] [place], [place] [address]   Salicylate level     Status: None   Collection Time: 06/04/18 12:25 AM  Result Value Ref Range   Salicylate Lvl <7.0 2.8 - 30.0 mg/dL    Comment: Performed at [facility], [address] [place], [place] [address]  Urine Drug Screen, Qualitative     Status: Abnormal   Collection Time: 06/04/18 12:25 AM  Result Value Ref Range   Tricyclic, Ur Screen POSITIVE (A) NONE DETECTED   Amphetamines, Ur Screen NONE DETECTED NONE DETECTED   MDMA (Ecstasy)Ur Screen NONE DETECTED NONE DETECTED   Cocaine Metabolite,Ur [place] NONE DETECTED NONE DETECTED   Opiate, Ur Screen NONE DETECTED NONE DETECTED   Phencyclidine (PCP) Ur S NONE DETECTED NONE DETECTED   Cannabinoid 50 Ng, Ur [place] NONE DETECTED NONE DETECTED   Barbiturates, Ur Screen NONE DETECTED NONE DETECTED   Benzodiazepine, Ur Scrn NONE DETECTED NONE DETECTED   Methadone Scn, Ur NONE DETECTED NONE DETECTED    Comment: (NOTE) Tricyclics + metabolites, urine    Cutoff 1000 ng/mL Amphetamines + metabolites, urine  Cutoff 1000 ng/mL MDMA (Ecstasy), urine              Cutoff 500 ng/mL Cocaine Metabolite, urine          Cutoff 300 ng/mL Opiate + metabolites, urine        Cutoff 300 ng/mL Phencyclidine (PCP), urine  Cutoff 25 ng/mL Cannabinoid, urine                 Cutoff 50 ng/mL Barbiturates + metabolites, urine  Cutoff 200 ng/mL Benzodiazepine, urine              Cutoff 200 ng/mL Methadone, urine                   Cutoff 300 ng/mL The urine drug screen provides only a preliminary, unconfirmed analytical test result and should not be used for  non-medical purposes. Clinical consideration and professional judgment should be applied to any positive drug screen result due to possible interfering substances. A more specific alternate chemical method must be used in order to obtain a confirmed analytical result. Gas chromatography / mass spectrometry (GC/MS) is the preferred confirmat ory method. Performed at [facility], [address] [place], [place] [address]   Troponin I - Once     Status: None   Collection Time: 06/04/18 12:25 AM  Result Value Ref Range   Troponin I <0.03 <0.03 ng/mL    Comment: Performed at [facility], [address] [place], [place] [address]  Urinalysis, Complete w Microscopic     Status: Abnormal   Collection Time: 06/04/18 12:26 AM  Result Value Ref Range   Color, Urine STRAW (A) YELLOW   APPearance CLEAR (A) CLEAR   Specific Gravity, Urine 1.008 1.005 - 1.030   pH 6.0 5.0 - 8.0   Glucose, UA NEGATIVE NEGATIVE mg/dL   Hgb urine dipstick NEGATIVE NEGATIVE   Bilirubin Urine NEGATIVE NEGATIVE   Ketones, ur NEGATIVE NEGATIVE mg/dL   Protein, ur NEGATIVE NEGATIVE mg/dL   Nitrite NEGATIVE NEGATIVE   Leukocytes, UA NEGATIVE NEGATIVE   RBC / HPF 0-5 0 - 5 RBC/hpf   WBC, UA 0-5 0 - 5 WBC/hpf   Bacteria, UA NONE SEEN NONE SEEN   Squamous Epithelial / LPF NONE SEEN 0 - 5   Mucus PRESENT     Comment: Performed at [facility], [address] [place], [place] [address]    No current facility-administered medications for this encounter.    Current Outpatient Medications  Medication Sig Dispense Refill  . benztropine (COGENTIN) 0.5 MG tablet Take 1 tablet (0.5 mg total) by mouth 2 (two) times daily. 60 tablet 0  . diphenhydrAMINE (BENADRYL) 50 MG tablet Take 50 mg by mouth 2 (two) times daily.    Marland Kitchen levofloxacin (LEVAQUIN) 500 MG tablet Take 500 mg by mouth daily. For 5 days    . metFORMIN (GLUCOPHAGE) 500 MG tablet Take 500 mg by mouth 2 (two) times daily.    Marland Kitchen  OLANZapine (ZYPREXA) 10 MG tablet Take 10 mg by mouth 2 (two) times daily.      Musculoskeletal: Strength & Muscle Tone: within normal limits Gait & Station: normal Patient leans: N/A  Psychiatric Specialty Exam: Physical Exam  Nursing note and vitals reviewed. Constitutional: He appears well-developed and well-nourished.  HENT:  Head: Normocephalic and atraumatic.  Eyes: Pupils are equal, round, and reactive to light. Conjunctivae are normal.  Neck: Normal range of motion.  Cardiovascular: Regular rhythm and normal heart sounds.  Respiratory: Effort normal. No respiratory distress.  GI: Soft.  Musculoskeletal: Normal range of motion.  Neurological: He is alert.  Skin: Skin is warm and dry.  Psychiatric: His affect is blunt. His speech is delayed. He is slowed. Thought content is delusional. Cognition and memory are impaired. He expresses impulsivity. He expresses no homicidal  and no suicidal ideation.    Review of Systems  Constitutional: Negative.   HENT: Negative.   Eyes: Negative.   Respiratory: Negative.   Cardiovascular: Negative.   Gastrointestinal: Negative.   Musculoskeletal: Negative.   Skin: Negative.   Neurological: Negative.   Psychiatric/Behavioral: Negative.     Blood pressure (!) 165/99, pulse (!) 113, temperature (!) 97.5 F (36.4 C), temperature source Oral, resp. rate 20, height 5\' 10"  (1.778 m), weight 90.7 kg, SpO2 98 %.Body mass index is 28.7 kg/m.  General Appearance: Casual  Eye Contact:  Fair  Speech:  Slow  Volume:  Decreased  Mood:  Euthymic  Affect:  Constricted  Thought Process:  Coherent  Orientation:  Full (Time, Place, and Person)  Thought Content:  Illogical, Delusions and Rumination  Suicidal Thoughts:  No  Homicidal Thoughts:  No  Memory:  Immediate;   Fair Recent;   Fair Remote;   Fair  Judgement:  Fair  Insight:  Shallow  Psychomotor Activity:  Normal  Concentration:  Concentration: Fair  Recall:  [employer] of Knowledge:   Fair  Language:  Fair  Akathisia:  No  Handed:  Right  AIMS (if indicated):     Assets:  Desire for Improvement Housing  ADL's:  Intact  Cognition:  Impaired,  Mild  Sleep:        Treatment Plan Summary: Daily contact with patient to assess and evaluate symptoms and progress in treatment, Medication management and Plan This is a patient with chronic schizophrenia or schizoaffective disorder.  Currently he is calm not threatening not aggressive not suicidal.  Agrees to taking psychiatric medicine.  He has a safe place to live in the community and to have his mental health issues addressed.  Has outpatient treatment already in place.  Patient no longer meets commitment criteria.  Does not require inpatient hospitalization.  He much prefers to go back to the group home.  Discontinued IV C.  Patient educated about the importance of compliance with medicine.  No new prescriptions written.  Case reviewed with TTS and emergency room physician.  Disposition: No evidence of imminent risk to self or others at present.   Patient does not meet criteria for psychiatric inpatient admission. Supportive therapy provided about ongoing stressors.  Mordecai Rasmussen, MD 06/04/2018 12:08 PM

## 2018-06-04 NOTE — ED Notes (Addendum)
RN contact legal guardian (Mr. Lorayne MarekGash).  Mr. Lorayne MarekGash made aware that the patient will be discharged from the ER.  Mr. Lorayne MarekGash stated he was surprised the patient had been stabilized so quickly. Mr. Lorayne MarekGash also stated the patient is NOT to be discharged with his cell phone.  Mr. Lorayne MarekGash will be here to pick up his phone in person later today.

## 2018-06-04 NOTE — ED Notes (Signed)
RN attempted to call group home ([facility]).  Call went directly to a voicemail box that has not been set up.

## 2018-06-04 NOTE — ED Notes (Signed)
RN called legal guardian for alternate contact number for [facility]. Left voicemail.

## 2018-06-04 NOTE — ED Notes (Signed)
Hourly rounding reveals patient sleeping in room. No complaints, stable, in no acute distress. Q15 minute rounds and monitoring via Security to continue. 

## 2018-06-04 NOTE — ED Notes (Signed)
RN attempted to contact group home staff again. Unsuccessful. Unable to leave voicemail.

## 2018-06-04 NOTE — ED Notes (Signed)
Patient woke up and came to door and said "I am ready to go", I want to sign out AMA. Introduced myself to patient and discussed with patient that he has to talk with another doctor and if that doctor (psychiatrist) says he can go we will let him go. Patient asked if he was IVC, Cyril LoosenKinner, MD showed patient his IVC paperwork, patient read the paperwork and said "ok" Patient is easily redirectable, but is anxious to leave. Denies SI/HI.

## 2018-06-04 NOTE — ED Notes (Signed)
Pt in dayroom with other patients. No behavioral issues. Maintained on 15 minute checks and observation by security camera for safety.

## 2018-06-04 NOTE — ED Notes (Addendum)
Report to include Situation, Background, Assessment, and Recommendations received from Amy B. RN. Patient alert, warm and dry, in no acute distress. Patient denies SI, HI, AVH and pain. Patient made aware of Q15 minute rounds and security cameras for their safety. Patient instructed to come to me with needs or concerns. 
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
## 2018-06-04 NOTE — ED Notes (Signed)
Patient informed [facility], patient was fine with the news and was appropriate and cooperative. Patient was very pleasant with Furniture conservator/restorerwriter and officer
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
## 2018-06-04 NOTE — ED Notes (Signed)
Hourly rounding reveals patient in room. No complaints, stable, in no acute distress. Q15 minute rounds and monitoring via Security Cameras to continue. 

## 2018-06-04 NOTE — ED Notes (Signed)
Pt to nurses station door frequently asking about his discharge. Staff reassuring patient we are working on coordinating  his ride home.   Maintained on 15 minute checks and observation by security camera for safety.

## 2018-06-04 NOTE — ED Notes (Signed)
Tech in room attempting blood draw and urine.
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
## 2018-06-04 NOTE — ED Notes (Signed)
Pt called 911 from his personal cell phone that pt was allowed to keep when brought back to room 22. Pt currently has coat, clothing, hat, cell phone and whatever else may be in his pockets. Pt reports he has bag. No bag charted.

## 2018-06-05 NOTE — ED Notes (Signed)
Patient discharged to group home, picked up by patient received discharge papers. Patient received belongings and verbalized he has received all of his belongings. Patient appropriate and cooperative, Denies SI/HI AVH. Vital signs taken. NAD noted.  Cellphone left in the nurses station for legal guardian to pick up

## 2018-06-05 NOTE — ED Notes (Signed)
Hourly rounding reveals patient sleeping in room. No complaints, stable, in no acute distress. Q15 minute rounds and monitoring via Security Cameras to continue. 

## 2018-06-05 NOTE — ED Provider Notes (Signed)
Vitals:   06/03/18 2351 06/04/18 1957  BP: (!) 165/99 120/68  Pulse: (!) 113 66  Resp: 20 16  Temp: (!) 97.5 F (36.4 C) 97.7 F (36.5 C)  SpO2: 98% 98%  No events overnight, patient remains medically stable   Emily FilbertWilliams, Laranda Burkemper E, MD 06/05/18 [phone]

## 2022-10-02 DEATH — deceased
